# Patient Record
Sex: Male | Born: 1985 | Race: Black or African American | Hispanic: No | Marital: Single | Smoking: Former smoker
Health system: Southern US, Community
[De-identification: ages and names within clinical notes are randomized; demographics above are authoritative.]

## PROBLEM LIST (undated history)

## (undated) DIAGNOSIS — K219 Gastro-esophageal reflux disease without esophagitis: Secondary | ICD-10-CM

## (undated) DIAGNOSIS — I10 Essential (primary) hypertension: Secondary | ICD-10-CM

## (undated) DIAGNOSIS — J45909 Unspecified asthma, uncomplicated: Secondary | ICD-10-CM

## (undated) DIAGNOSIS — F909 Attention-deficit hyperactivity disorder, unspecified type: Secondary | ICD-10-CM

## (undated) HISTORY — PX: TONSILLECTOMY: SUR1361

---

## 2005-10-23 ENCOUNTER — Emergency Department (HOSPITAL_COMMUNITY): Admission: EM | Admit: 2005-10-23 | Discharge: 2005-10-23 | Payer: Self-pay | Admitting: Emergency Medicine

## 2006-07-05 ENCOUNTER — Emergency Department (HOSPITAL_COMMUNITY): Admission: EM | Admit: 2006-07-05 | Discharge: 2006-07-05 | Payer: Self-pay | Admitting: Emergency Medicine

## 2007-08-05 ENCOUNTER — Emergency Department (HOSPITAL_COMMUNITY): Admission: EM | Admit: 2007-08-05 | Discharge: 2007-08-05 | Payer: Self-pay | Admitting: Emergency Medicine

## 2007-11-17 ENCOUNTER — Emergency Department (HOSPITAL_COMMUNITY): Admission: EM | Admit: 2007-11-17 | Discharge: 2007-11-17 | Payer: Self-pay | Admitting: Emergency Medicine

## 2008-02-20 ENCOUNTER — Ambulatory Visit (HOSPITAL_COMMUNITY): Admission: RE | Admit: 2008-02-20 | Discharge: 2008-02-20 | Payer: Self-pay | Admitting: Urology

## 2008-04-08 ENCOUNTER — Emergency Department (HOSPITAL_COMMUNITY): Admission: EM | Admit: 2008-04-08 | Discharge: 2008-04-08 | Payer: Self-pay | Admitting: Emergency Medicine

## 2008-04-17 ENCOUNTER — Ambulatory Visit (HOSPITAL_COMMUNITY): Admission: RE | Admit: 2008-04-17 | Discharge: 2008-04-17 | Payer: Self-pay | Admitting: Internal Medicine

## 2008-07-02 ENCOUNTER — Emergency Department (HOSPITAL_COMMUNITY): Admission: EM | Admit: 2008-07-02 | Discharge: 2008-07-02 | Payer: Self-pay | Admitting: Emergency Medicine

## 2009-03-05 ENCOUNTER — Emergency Department (HOSPITAL_COMMUNITY): Admission: EM | Admit: 2009-03-05 | Discharge: 2009-03-05 | Payer: Self-pay | Admitting: Emergency Medicine

## 2009-04-14 ENCOUNTER — Emergency Department (HOSPITAL_COMMUNITY): Admission: EM | Admit: 2009-04-14 | Discharge: 2009-04-14 | Payer: Self-pay | Admitting: Emergency Medicine

## 2009-04-30 IMAGING — CT CT PARANASAL SINUSES LIMITED
2 series · 16 of 24 positions shown, 19 images · non-contrast
Comparison: None

CLINICAL DATA: Cough

CT PARANASAL SINUS LIMITED WITHOUT CONTRAST
TECHNIQUE: Multidetector CT images of the paranasal sinuses were
obtained in a single plane without contrast.

[Series 6: sinusprone 5.0 h31s · axial · 0.34mm/px · z∈[+177,+271]mm · 10 of 12 slices shown, 13 images (1 of 2)]
[im 2/12  brain]
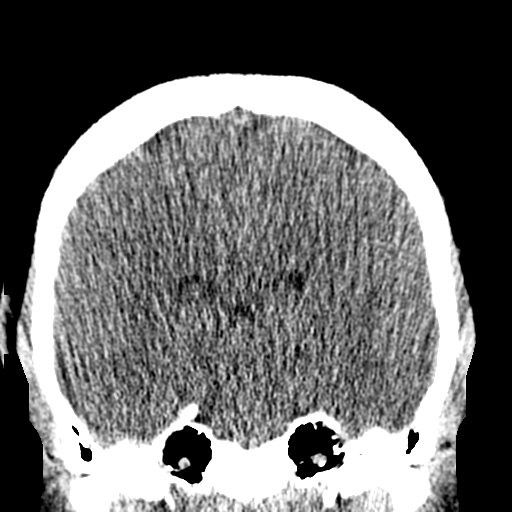
[im 2/12  bone]
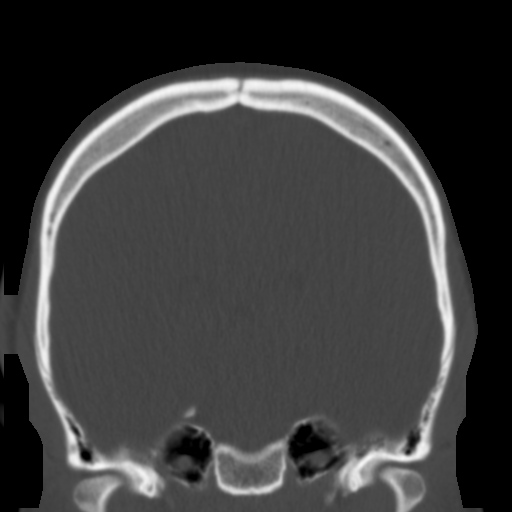
[im 3/12  bone]
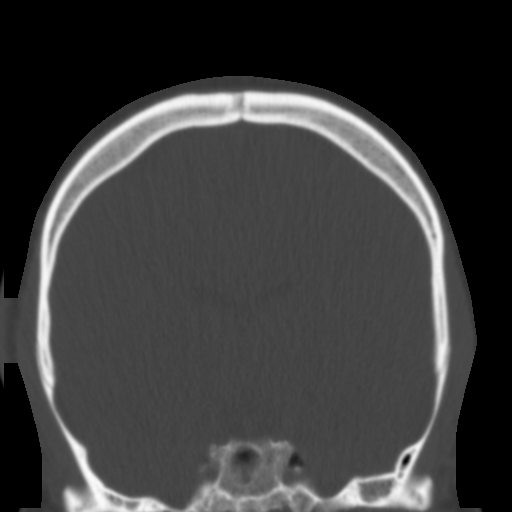
[im 4/12  bone]
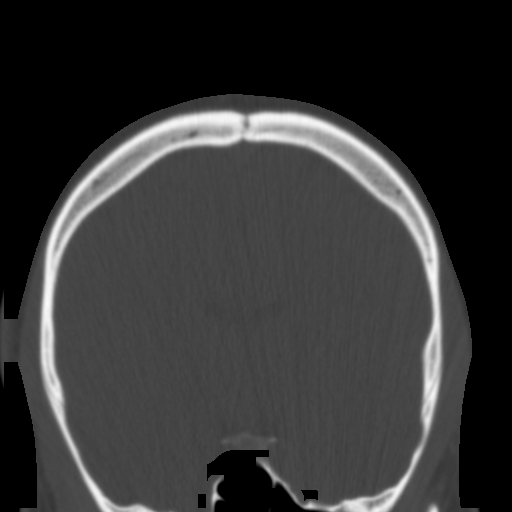
[im 5/12  bone]
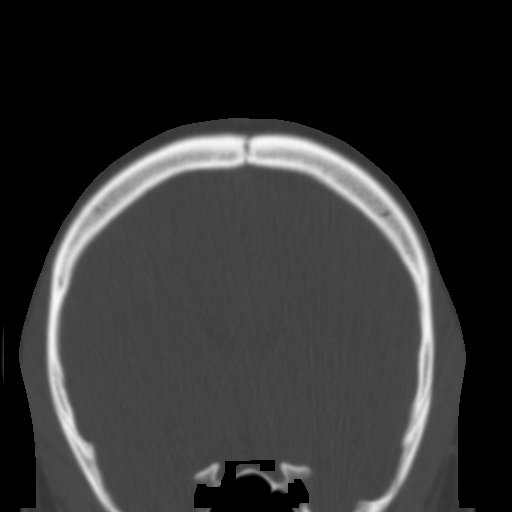
[im 6/12  brain]
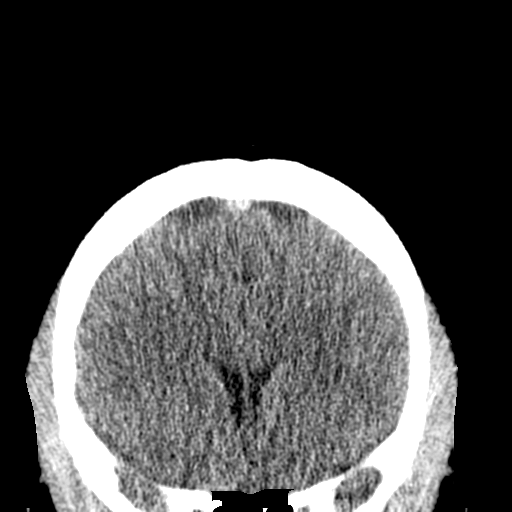
[im 6/12  bone]
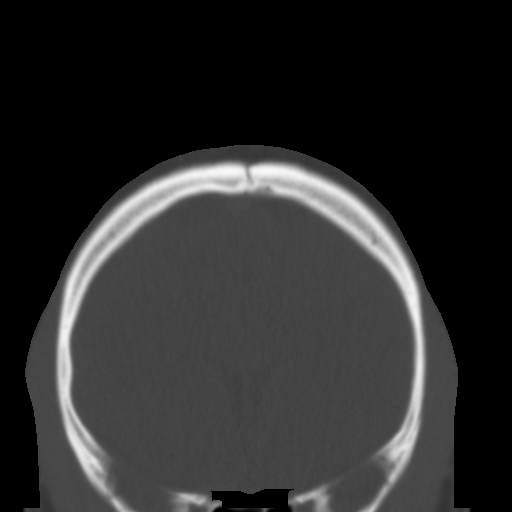
[im 7/12  bone]
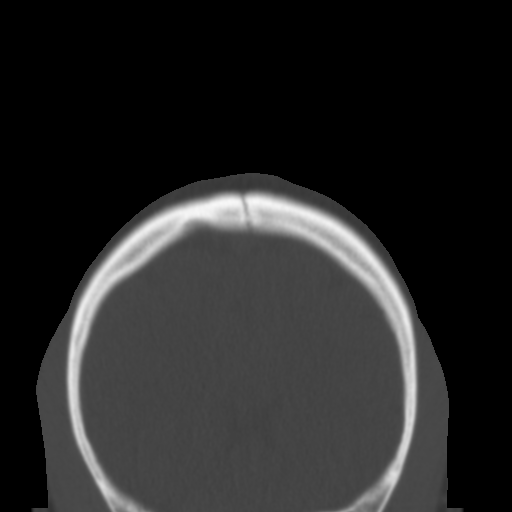
[im 8/12  bone]
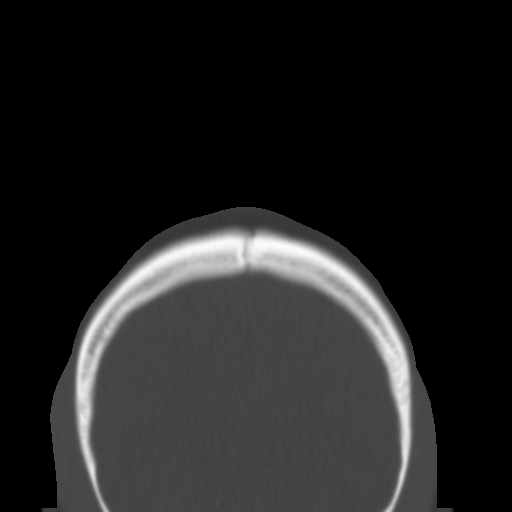
[im 9/12  bone]
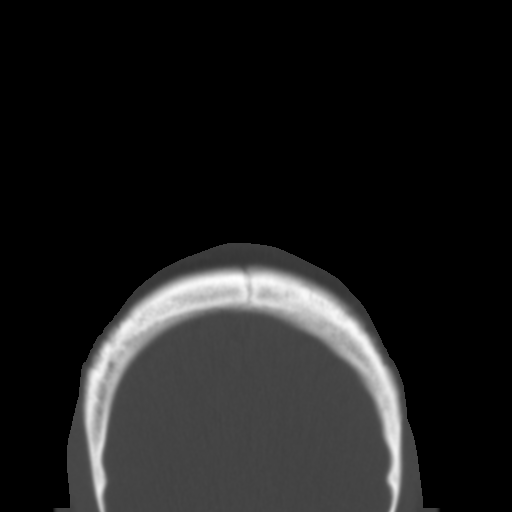
[im 10/12  brain]
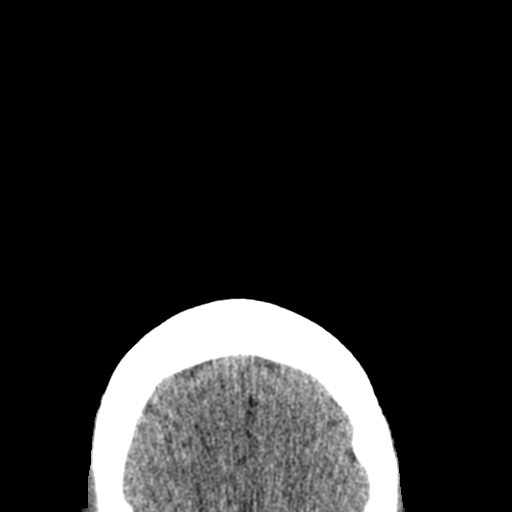
[im 10/12  bone]
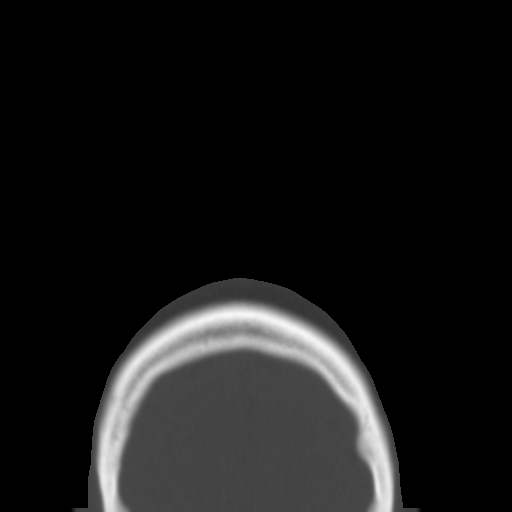
[im 11/12  bone]
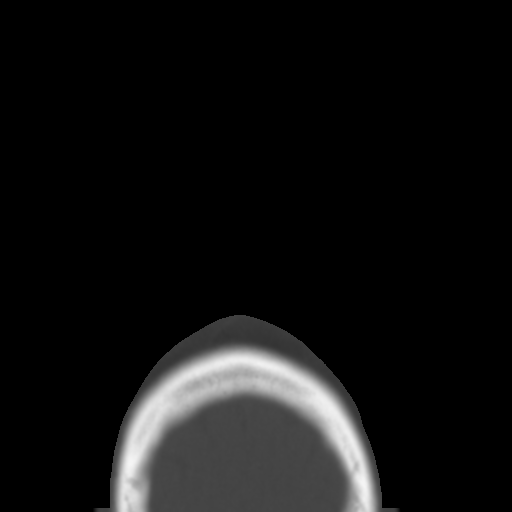

[Series 7: sinusprone 5.0 h31s · axial · 0.30mm/px · z∈[+125,+188]mm · 6 of 12 slices shown (2 of 2)]
[im 2/12  bone]
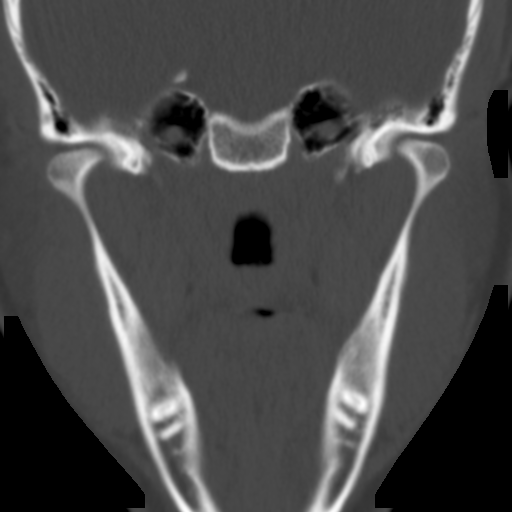
[im 3/12  bone]
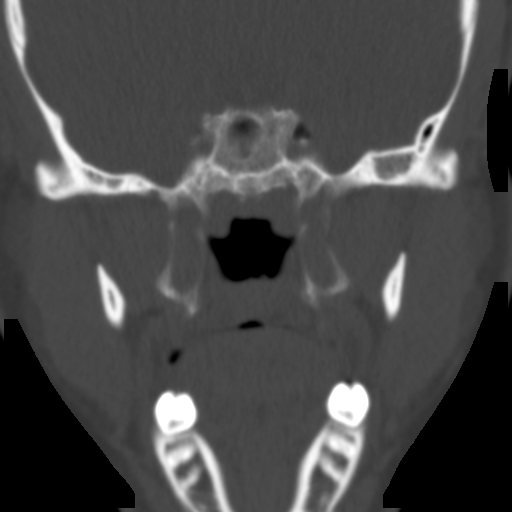
[im 5/12  bone]
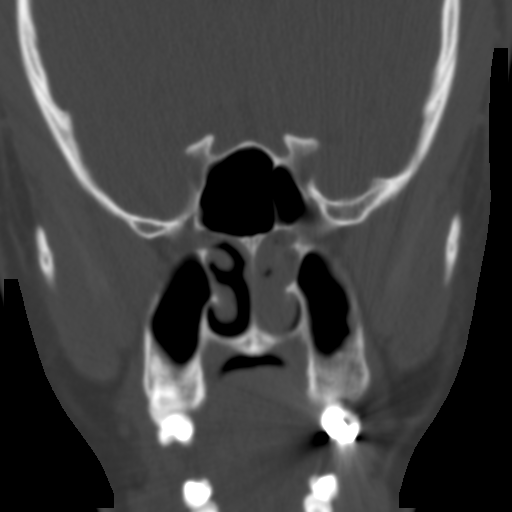
[im 6/12  bone]
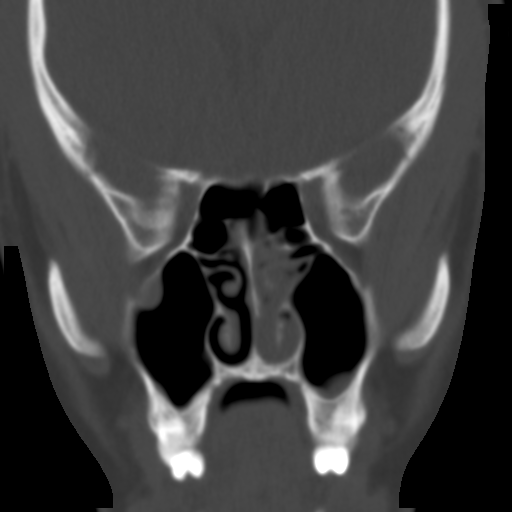
[im 7/12  bone]
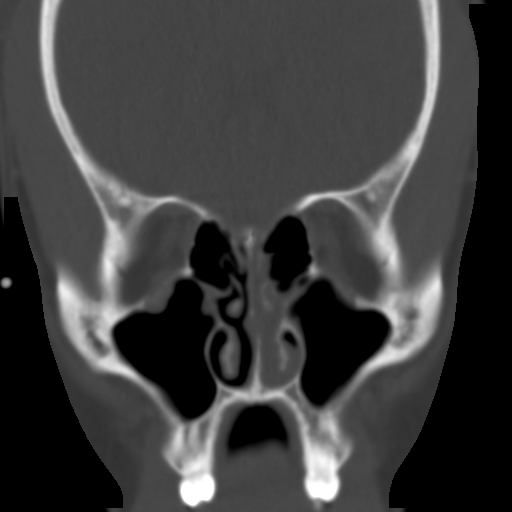
[im 8/12  bone]
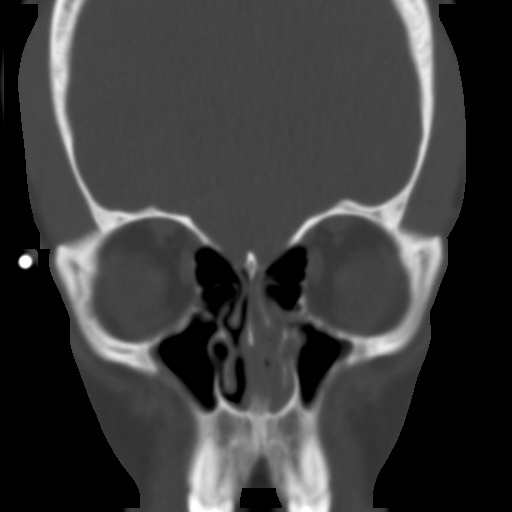

[16 of 24 positions shown; findings below may reference images not displayed]

FINDINGS: Limited survey of the paranasal sinuses demonstrates
scattered areas of mucosal thickening of the maxillary sinuses.
There is some thickening of some of the ethmoid air cells on the
left.  Asymmetric prominence of the nasal turbinates on the left.
Visualized portion of the sphenoid sinuses and the frontal sinuses
are clear.  No air-fluid levels are identified in the sinuses.  The
mandibular condyles are located.  Visualized orbital soft tissues
are unremarkable.
IMPRESSION: 1. Scattered mucosal thickening of the maxillary sinuses and left
ethmoid air cells, and prominence of the nasal turbinates on the
left.  These findings can be seen in patients with chronic
sinusitis. Nasal turbinate thickening can be seen in the setting of
allergic rhinitis.
2.  No air-fluid levels to suggest acute sinusitis are identified.

## 2010-02-08 ENCOUNTER — Emergency Department (HOSPITAL_COMMUNITY): Admission: EM | Admit: 2010-02-08 | Discharge: 2010-02-08 | Payer: Self-pay | Admitting: Emergency Medicine

## 2011-01-09 ENCOUNTER — Encounter: Payer: Self-pay | Admitting: Urology

## 2011-03-30 LAB — URINALYSIS, ROUTINE W REFLEX MICROSCOPIC
Bilirubin Urine: NEGATIVE
Leukocytes, UA: NEGATIVE
Protein, ur: NEGATIVE mg/dL
Urobilinogen, UA: 0.2 mg/dL (ref 0.0–1.0)

## 2011-03-30 LAB — RPR: RPR Ser Ql: NONREACTIVE

## 2011-03-30 LAB — GC/CHLAMYDIA PROBE AMP, GENITAL: Chlamydia, DNA Probe: NEGATIVE

## 2011-03-30 LAB — URINE MICROSCOPIC-ADD ON

## 2011-09-16 LAB — URINALYSIS, ROUTINE W REFLEX MICROSCOPIC
Bilirubin Urine: NEGATIVE
pH: 6

## 2011-09-16 LAB — CBC
HCT: 43.4
Hemoglobin: 14.4
MCV: 86
Platelets: 215
RBC: 5.04
RDW: 13.8
WBC: 5.9

## 2011-09-16 LAB — COMPREHENSIVE METABOLIC PANEL
Albumin: 3.7
CO2: 29
Calcium: 8.9
Creatinine, Ser: 1.03
Total Bilirubin: 0.4

## 2011-09-16 LAB — DIFFERENTIAL
Basophils Absolute: 0
Basophils Relative: 0
Lymphs Abs: 2.1
Monocytes Relative: 6
Neutrophils Relative %: 54

## 2011-09-16 LAB — LIPASE, BLOOD: Lipase: 30

## 2012-05-28 ENCOUNTER — Encounter (HOSPITAL_COMMUNITY): Payer: Self-pay | Admitting: Emergency Medicine

## 2012-05-28 ENCOUNTER — Emergency Department (HOSPITAL_COMMUNITY): Payer: Medicaid Other

## 2012-05-28 ENCOUNTER — Emergency Department (HOSPITAL_COMMUNITY)
Admission: EM | Admit: 2012-05-28 | Discharge: 2012-05-28 | Disposition: A | Discharge status: Home / Self Care | Payer: Medicaid Other | Attending: Emergency Medicine | Admitting: Emergency Medicine

## 2012-05-28 DIAGNOSIS — M549 Dorsalgia, unspecified: Secondary | ICD-10-CM | POA: Insufficient documentation

## 2012-05-28 DIAGNOSIS — T07XXXA Unspecified multiple injuries, initial encounter: Secondary | ICD-10-CM

## 2012-05-28 DIAGNOSIS — IMO0002 Reserved for concepts with insufficient information to code with codable children: Secondary | ICD-10-CM | POA: Insufficient documentation

## 2012-05-28 DIAGNOSIS — R109 Unspecified abdominal pain: Secondary | ICD-10-CM | POA: Insufficient documentation

## 2012-05-28 DIAGNOSIS — R404 Transient alteration of awareness: Secondary | ICD-10-CM | POA: Insufficient documentation

## 2012-05-28 DIAGNOSIS — I10 Essential (primary) hypertension: Secondary | ICD-10-CM | POA: Insufficient documentation

## 2012-05-28 DIAGNOSIS — M542 Cervicalgia: Secondary | ICD-10-CM | POA: Insufficient documentation

## 2012-05-28 DIAGNOSIS — M25569 Pain in unspecified knee: Secondary | ICD-10-CM | POA: Insufficient documentation

## 2012-05-28 HISTORY — DX: Unspecified asthma, uncomplicated: J45.909

## 2012-05-28 HISTORY — DX: Essential (primary) hypertension: I10

## 2012-05-28 LAB — CBC
HCT: 46.1 % (ref 39.0–52.0)
Hemoglobin: 15.6 g/dL (ref 13.0–17.0)
RDW: 13.6 % (ref 11.5–15.5)
WBC: 10.2 10*3/uL (ref 4.0–10.5)

## 2012-05-28 LAB — RAPID URINE DRUG SCREEN, HOSP PERFORMED
Amphetamines: NOT DETECTED
Barbiturates: NOT DETECTED
Benzodiazepines: NOT DETECTED
Tetrahydrocannabinol: POSITIVE — AB

## 2012-05-28 LAB — COMPREHENSIVE METABOLIC PANEL
Alkaline Phosphatase: 51 U/L (ref 39–117)
BUN: 13 mg/dL (ref 6–23)
CO2: 25 mEq/L (ref 19–32)
Calcium: 9.7 mg/dL (ref 8.4–10.5)
GFR calc Af Amer: 78 mL/min — ABNORMAL LOW (ref >90)
GFR calc non Af Amer: 67 mL/min — ABNORMAL LOW (ref >90)
Glucose, Bld: 100 mg/dL — ABNORMAL HIGH (ref 70–99)
Total Protein: 8 g/dL (ref 6.0–8.3)

## 2012-05-28 LAB — POCT I-STAT, CHEM 8
HCT: 49 % (ref 39.0–52.0)
Hemoglobin: 16.7 g/dL (ref 13.0–17.0)
Potassium: 3.9 mEq/L (ref 3.5–5.1)
Sodium: 142 mEq/L (ref 135–145)
TCO2: 26 mmol/L (ref 0–100)

## 2012-05-28 LAB — URINALYSIS, MICROSCOPIC ONLY
Hgb urine dipstick: NEGATIVE
Leukocytes, UA: NEGATIVE
Nitrite: NEGATIVE
Protein, ur: NEGATIVE mg/dL
Specific Gravity, Urine: 1.038 — ABNORMAL HIGH (ref 1.005–1.030)
Urobilinogen, UA: 0.2 mg/dL (ref 0.0–1.0)

## 2012-05-28 LAB — ETHANOL: Alcohol, Ethyl (B): <11 mg/dL (ref 0–11)

## 2012-05-28 LAB — SAMPLE TO BLOOD BANK

## 2012-05-28 MED ORDER — IOHEXOL 300 MG/ML  SOLN
100.0000 mL | Freq: Once | INTRAMUSCULAR | Status: AC | PRN
  Administered 2012-05-28: 100 mL via INTRAVENOUS

## 2012-05-28 MED ORDER — FENTANYL CITRATE 0.05 MG/ML IJ SOLN
50.0000 ug | Freq: Once | INTRAMUSCULAR | Status: AC
  Administered 2012-05-28: 50 ug via INTRAVENOUS
  Filled 2012-05-28: qty 2

## 2012-05-28 MED ORDER — OXYCODONE-ACETAMINOPHEN 5-325 MG PO TABS
1.0000 | ORAL_TABLET | Freq: Once | ORAL | Status: AC
  Administered 2012-05-28: 1 via ORAL
  Filled 2012-05-28: qty 1

## 2012-05-28 MED ORDER — FENTANYL CITRATE 0.05 MG/ML IJ SOLN
50.0000 ug | Freq: Once | INTRAMUSCULAR | Status: AC
  Administered 2012-05-28: 50 ug via INTRAVENOUS
  Filled 2012-05-28 (×2): qty 2

## 2012-05-28 MED ORDER — OXYCODONE-ACETAMINOPHEN 5-325 MG PO TABS: 1.0000 | ORAL_TABLET | ORAL | Status: AC | PRN

## 2012-05-28 MED ORDER — SODIUM CHLORIDE 0.9 % IV BOLUS (SEPSIS)
1000.0000 mL | Freq: Once | INTRAVENOUS | Status: AC
  Administered 2012-05-28: 1000 mL via INTRAVENOUS

## 2012-05-28 MED ORDER — ONDANSETRON HCL 4 MG/2ML IJ SOLN
4.0000 mg | Freq: Once | INTRAMUSCULAR | Status: AC
  Administered 2012-05-28: 4 mg via INTRAVENOUS
  Filled 2012-05-28: qty 2
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
## 2012-05-28 NOTE — ED Notes (Signed)
Patient currently in C-spine and long spine board.
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
## 2016-06-05 ENCOUNTER — Emergency Department (HOSPITAL_COMMUNITY)
Admission: EM | Admit: 2016-06-05 | Discharge: 2016-06-05 | Disposition: A | Discharge status: Home / Self Care | Payer: Medicaid Other | Attending: Emergency Medicine | Admitting: Emergency Medicine

## 2016-06-05 ENCOUNTER — Encounter (HOSPITAL_COMMUNITY): Payer: Self-pay

## 2016-06-05 DIAGNOSIS — M542 Cervicalgia: Secondary | ICD-10-CM | POA: Insufficient documentation

## 2016-06-05 DIAGNOSIS — Z79899 Other long term (current) drug therapy: Secondary | ICD-10-CM | POA: Insufficient documentation

## 2016-06-05 DIAGNOSIS — G8929 Other chronic pain: Secondary | ICD-10-CM | POA: Diagnosis not present

## 2016-06-05 DIAGNOSIS — M549 Dorsalgia, unspecified: Secondary | ICD-10-CM | POA: Insufficient documentation

## 2016-06-05 DIAGNOSIS — I10 Essential (primary) hypertension: Secondary | ICD-10-CM | POA: Insufficient documentation

## 2016-06-05 DIAGNOSIS — J45909 Unspecified asthma, uncomplicated: Secondary | ICD-10-CM | POA: Diagnosis not present
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
## 2016-06-05 NOTE — Discharge Instructions (Signed)
°Cervical Strain and Sprain With Rehab °Cervical strain and sprain are injuries that commonly occur with "whiplash" injuries. Whiplash occurs when the neck is forcefully whipped backward or forward, such as during a motor vehicle accident or during contact sports. The muscles, ligaments, tendons, discs, and nerves of the neck are susceptible to injury when this occurs. °RISK FACTORS °Risk of having a whiplash injury increases if: °· Osteoarthritis of the spine. °· Situations that make head or neck accidents or trauma more likely. °· High-risk sports (football, rugby, wrestling, hockey, auto racing, gymnastics, diving, contact karate, or boxing). °· Poor strength and flexibility of the neck. °· Previous neck injury. °· Poor tackling technique. °· Improperly fitted or padded equipment. °SYMPTOMS  °· Pain or stiffness in the front or back of neck or both. °· Symptoms may present immediately or up to 24 hours after injury. °· Dizziness, headache, nausea, and vomiting. °· Muscle spasm with soreness and stiffness in the neck. °· Tenderness and swelling at the injury site. °PREVENTION °· Learn and use proper technique (avoid tackling with the head, spearing, and head-butting; use proper falling techniques to avoid landing on the head). °· Warm up and stretch properly before activity. °· Maintain physical fitness: °¨ Strength, flexibility, and endurance. °¨ Cardiovascular fitness. °· Wear properly fitted and padded protective equipment, such as padded soft collars, for participation in contact sports. °PROGNOSIS  °Recovery from cervical strain and sprain injuries is dependent on the extent of the injury. These injuries are usually curable in 1 week to 3 months with appropriate treatment.  °RELATED COMPLICATIONS  °· Temporary numbness and weakness may occur if the nerve roots are damaged, and this may persist until the nerve has completely healed. °· Chronic pain due to frequent recurrence of symptoms. °· Prolonged healing,  especially if activity is resumed too soon (before complete recovery). °TREATMENT  °Treatment initially involves the use of ice and medication to help reduce pain and inflammation. It is also important to perform strengthening and stretching exercises and modify activities that worsen symptoms so the injury does not get worse. These exercises may be performed at home or with a therapist. For patients who experience severe symptoms, a soft, padded collar may be recommended to be worn around the neck.  °Improving your posture may help reduce symptoms. Posture improvement includes pulling your chin and abdomen in while sitting or standing. If you are sitting, sit in a firm chair with your buttocks against the back of the chair. While sleeping, try replacing your pillow with a small towel rolled to 2 inches in diameter, or use a cervical pillow or soft cervical collar. Poor sleeping positions delay healing.  °For patients with nerve root damage, which causes numbness or weakness, the use of a cervical traction apparatus may be recommended. Surgery is rarely necessary for these injuries. However, cervical strain and sprains that are present at birth (congenital) may require surgery. °MEDICATION  °· If pain medication is necessary, nonsteroidal anti-inflammatory medications, such as aspirin and ibuprofen, or other minor pain relievers, such as acetaminophen, are often recommended. °· Do not take pain medication for 7 days before surgery. °· Prescription pain relievers may be given if deemed necessary by your caregiver. Use only as directed and only as much as you need. °HEAT AND COLD:  °· Cold treatment (icing) relieves pain and reduces inflammation. Cold treatment should be applied for 10 to 15 minutes every 2 to 3 hours for inflammation and pain and immediately after any activity that aggravates your   HEAT AND COLD:   · Cold treatment (icing) relieves pain and reduces inflammation. Cold treatment should be applied for 10 to 15 minutes every 2 to 3 hours for inflammation and pain and immediately after any activity that aggravates your symptoms. Use ice packs or an ice massage.  · Heat treatment may be used prior to performing the stretching and  strengthening activities prescribed by your caregiver, physical therapist, or athletic trainer. Use a heat pack or a warm soak.  SEEK MEDICAL CARE IF:   · Symptoms get worse or do not improve in 2 weeks despite treatment.  · New, unexplained symptoms develop (drugs used in treatment may produce side effects).  EXERCISES  RANGE OF MOTION (ROM) AND STRETCHING EXERCISES - Cervical Strain and Sprain  These exercises may help you when beginning to rehabilitate your injury. In order to successfully resolve your symptoms, you must improve your posture. These exercises are designed to help reduce the forward-head and rounded-shoulder posture which contributes to this condition. Your symptoms may resolve with or without further involvement from your physician, physical therapist or athletic trainer. While completing these exercises, remember:   · Restoring tissue flexibility helps normal motion to return to the joints. This allows healthier, less painful movement and activity.  · An effective stretch should be held for at least 20 seconds, although you may need to begin with shorter hold times for comfort.  · A stretch should never be painful. You should only feel a gentle lengthening or release in the stretched tissue.  STRETCH- Axial Extensors  · Lie on your back on the floor. You may bend your knees for comfort. Place a rolled-up hand towel or dish towel, about 2 inches in diameter, under the part of your head that makes contact with the floor.  · Gently tuck your chin, as if trying to make a "double chin," until you feel a gentle stretch at the base of your head.  · Hold __________ seconds.  Repeat __________ times. Complete this exercise __________ times per day.   STRETCH - Axial Extension   · Stand or sit on a firm surface. Assume a good posture: chest up, shoulders drawn back, abdominal muscles slightly tense, knees unlocked (if standing) and feet hip width apart.  · Slowly retract your chin so your head slides back  and your chin slightly lowers. Continue to look straight ahead.  · You should feel a gentle stretch in the back of your head. Be certain not to feel an aggressive stretch since this can cause headaches later.  · Hold for __________ seconds.  Repeat __________ times. Complete this exercise __________ times per day.  STRETCH - Cervical Side Bend   · Stand or sit on a firm surface. Assume a good posture: chest up, shoulders drawn back, abdominal muscles slightly tense, knees unlocked (if standing) and feet hip width apart.  · Without letting your nose or shoulders move, slowly tip your right / left ear to your shoulder until your feel a gentle stretch in the muscles on the opposite side of your neck.  · Hold __________ seconds.  Repeat __________ times. Complete this exercise __________ times per day.  STRETCH - Cervical Rotators   · Stand or sit on a firm surface. Assume a good posture: chest up, shoulders drawn back, abdominal muscles slightly tense, knees unlocked (if standing) and feet hip width apart.  · Keeping your eyes level with the ground, slowly turn your head until you feel a gentle stretch along   the back and opposite side of your neck.  · Hold __________ seconds.  Repeat __________ times. Complete this exercise __________ times per day.  RANGE OF MOTION - Neck Circles   · Stand or sit on a firm surface. Assume a good posture: chest up, shoulders drawn back, abdominal muscles slightly tense, knees unlocked (if standing) and feet hip width apart.  · Gently roll your head down and around from the back of one shoulder to the back of the other. The motion should never be forced or painful.  · Repeat the motion 10-20 times, or until you feel the neck muscles relax and loosen.  Repeat __________ times. Complete the exercise __________ times per day.  STRENGTHENING EXERCISES - Cervical Strain and Sprain  These exercises may help you when beginning to rehabilitate your injury. They may resolve your symptoms with or  without further involvement from your physician, physical therapist, or athletic trainer. While completing these exercises, remember:   · Muscles can gain both the endurance and the strength needed for everyday activities through controlled exercises.  · Complete these exercises as instructed by your physician, physical therapist, or athletic trainer. Progress the resistance and repetitions only as guided.  · You may experience muscle soreness or fatigue, but the pain or discomfort you are trying to eliminate should never worsen during these exercises. If this pain does worsen, stop and make certain you are following the directions exactly. If the pain is still present after adjustments, discontinue the exercise until you can discuss the trouble with your clinician.  STRENGTH - Cervical Flexors, Isometric  · Face a wall, standing about 6 inches away. Place a small pillow, a ball about 6-8 inches in diameter, or a folded towel between your forehead and the wall.  · Slightly tuck your chin and gently push your forehead into the soft object. Push only with mild to moderate intensity, building up tension gradually. Keep your jaw and forehead relaxed.  · Hold 10 to 20 seconds. Keep your breathing relaxed.  · Release the tension slowly. Relax your neck muscles completely before you start the next repetition.  Repeat __________ times. Complete this exercise __________ times per day.  STRENGTH- Cervical Lateral Flexors, Isometric   · Stand about 6 inches away from a wall. Place a small pillow, a ball about 6-8 inches in diameter, or a folded towel between the side of your head and the wall.  · Slightly tuck your chin and gently tilt your head into the soft object. Push only with mild to moderate intensity, building up tension gradually. Keep your jaw and forehead relaxed.  · Hold 10 to 20 seconds. Keep your breathing relaxed.  · Release the tension slowly. Relax your neck muscles completely before you start the next  repetition.  Repeat __________ times. Complete this exercise __________ times per day.  STRENGTH - Cervical Extensors, Isometric   · Stand about 6 inches away from a wall. Place a small pillow, a ball about 6-8 inches in diameter, or a folded towel between the back of your head and the wall.  · Slightly tuck your chin and gently tilt your head back into the soft object. Push only with mild to moderate intensity, building up tension gradually. Keep your jaw and forehead relaxed.  · Hold 10 to 20 seconds. Keep your breathing relaxed.  · Release the tension slowly. Relax your neck muscles completely before you start the next repetition.  Repeat __________ times. Complete this exercise __________ times per day.    All of your joints have less wear and tear when properly supported by a spine with good posture. This means you will experience a healthier, less painful body.  Correct posture must be practiced with all of your activities, especially prolonged sitting and standing. Correct posture is as important when doing repetitive low-stress activities (typing) as it is when doing a single heavy-load activity (lifting). PROLONGED STANDING WHILE SLIGHTLY LEANING FORWARD When completing a task that requires you to lean forward while standing in one  place for a long time, place either foot up on a stationary 2- to 4-inch high object to help maintain the best posture. When both feet are on the ground, the low back tends to lose its slight inward curve. If this curve flattens (or becomes too large), then the back and your other joints will experience too much stress, fatigue more quickly, and can cause pain.  RESTING POSITIONS Consider which positions are most painful for you when choosing a resting position. If you have pain with flexion-based activities (sitting, bending, stooping, squatting), choose a position that allows you to rest in a less flexed posture. You would want to avoid curling into a fetal position on your side. If your pain worsens with extension-based activities (prolonged standing, working overhead), avoid resting in an extended position such as sleeping on your stomach. Most people will find more comfort when they rest with their spine in a more neutral position, neither too rounded nor too arched. Lying on a non-sagging bed on your side with a pillow between your knees, or on your back with a pillow under your knees will often provide some relief. Keep in mind, being in any one position for a prolonged period of time, no matter how correct your posture, can still lead to stiffness. WALKING Walk with an upright posture. Your ears, shoulders, and hips should all line up. OFFICE WORK When working at a desk, create an environment that supports good, upright posture. Without extra support, muscles fatigue and lead to excessive strain on joints and other tissues. CHAIR:  A chair should be able to slide under your desk when your back makes contact with the back of the chair. This allows you to work closely.  The chair's height should allow your eyes to be level with the upper part of your monitor and your hands to be slightly lower than your elbows.  Body position:  Your feet should make contact with the floor. If this is not  possible, use a foot rest.  Keep your ears over your shoulders. This will reduce stress on your neck and low back.   This information is not intended to replace advice given to you by your health care provider. Make sure you discuss any questions you have with your health care provider.   Document Released: 12/05/2005 Document Revised: 12/26/2014 Document Reviewed: 03/19/2009 Elsevier Interactive Patient Education 2016 [employer]  Chronic Pain Chronic pain can be defined as pain that is off and on and lasts for 3-6 months or longer. Many things cause chronic pain, which can make it difficult to make a diagnosis. There are many treatment options available for chronic pain. However, finding a treatment that works well for you may require trying various approaches until the right one is found. Many people benefit from a combination of two or more types of treatment to control their pain. SYMPTOMS  Chronic pain can occur anywhere in the body and can range from mild to very severe. Some types of chronic  pain include:  Headache.  Low back pain.  Cancer pain.  Arthritis pain.  Neurogenic pain. This is pain resulting from damage to nerves. People with chronic pain may also have other symptoms such as:  Depression.  Anger.  Insomnia.  Anxiety. DIAGNOSIS  Your health care provider will help diagnose your condition over time. In many cases, the initial focus will be on excluding possible conditions that could be causing the pain. Depending on your symptoms, your health care provider may order tests to diagnose your condition. Some of these tests may include:   Blood tests.   CT scan.   MRI.   X-rays.   Ultrasounds.   Nerve conduction studies.  You may need to see a specialist.  TREATMENT  Finding treatment that works well may take time. You may be referred to a pain specialist. He or she may prescribe medicine or therapies, such as:   Mindful meditation or  yoga.  Shots (injections) of numbing or pain-relieving medicines into the spine or area of pain.  Local electrical stimulation.  Acupuncture.   Massage therapy.   Aroma, color, light, or sound therapy.   Biofeedback.   Working with a physical therapist to keep from getting stiff.   Regular, gentle exercise.   Cognitive or behavioral therapy.   Group support.  Sometimes, surgery may be recommended.  HOME CARE INSTRUCTIONS   Take all medicines as directed by your health care provider.   Lessen stress in your life by relaxing and doing things such as listening to calming music.   Exercise or be active as directed by your health care provider.   Eat a healthy diet and include things such as vegetables, fruits, fish, and lean meats in your diet.   Keep all follow-up appointments with your health care provider.   Attend a support group with others suffering from chronic pain. SEEK MEDICAL CARE IF:   Your pain gets worse.   You develop a new pain that was not there before.   You cannot tolerate medicines given to you by your health care provider.   You have new symptoms since your last visit with your health care provider.  SEEK IMMEDIATE MEDICAL CARE IF:   You feel weak.   You have decreased sensation or numbness.   You lose control of bowel or bladder function.   Your pain suddenly gets much worse.   You develop shaking.  You develop chills.  You develop confusion.  You develop chest pain.  You develop shortness of breath.  MAKE SURE YOU:  Understand these instructions.  Will watch your condition.  Will get help right away if you are not doing well or get worse.   This information is not intended to replace advice given to you by your health care provider. Make sure you discuss any questions you have with your health care provider.   Document Released: 08/27/2002 Document Revised: 08/07/2013 Document Reviewed:  05/31/2013 Elsevier Interactive Patient Education [employer]

## 2016-06-05 NOTE — ED Notes (Signed)
Declined W/C at D/C and was escorted to lobby by RN. 
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
## 2019-08-02 ENCOUNTER — Other Ambulatory Visit: Payer: Self-pay

## 2019-08-02 DIAGNOSIS — Z20822 Contact with and (suspected) exposure to covid-19: Secondary | ICD-10-CM

## 2019-08-03 LAB — NOVEL CORONAVIRUS, NAA: SARS-CoV-2, NAA: NOT DETECTED

## 2020-07-13 ENCOUNTER — Ambulatory Visit
Admission: EM | Admit: 2020-07-13 | Discharge: 2020-07-13 | Disposition: A | Discharge status: Home / Self Care | Payer: Medicaid Other | Attending: Family Medicine | Admitting: Family Medicine

## 2020-07-13 ENCOUNTER — Encounter: Payer: Self-pay | Admitting: Emergency Medicine

## 2020-07-13 DIAGNOSIS — B369 Superficial mycosis, unspecified: Secondary | ICD-10-CM

## 2020-07-13 DIAGNOSIS — Z113 Encounter for screening for infections with a predominantly sexual mode of transmission: Secondary | ICD-10-CM | POA: Insufficient documentation

## 2020-07-13 DIAGNOSIS — R319 Hematuria, unspecified: Secondary | ICD-10-CM | POA: Insufficient documentation

## 2020-07-13 LAB — POCT URINALYSIS DIP (MANUAL ENTRY)
Bilirubin, UA: NEGATIVE
Glucose, UA: NEGATIVE mg/dL
Ketones, POC UA: NEGATIVE mg/dL
Leukocytes, UA: NEGATIVE
Nitrite, UA: NEGATIVE
Protein Ur, POC: NEGATIVE mg/dL
Spec Grav, UA: 1.015 (ref 1.010–1.025)
Urobilinogen, UA: 1 E.U./dL
pH, UA: 7 (ref 5.0–8.0)

## 2020-07-13 MED ORDER — CLOTRIMAZOLE 1 % EX CREA: TOPICAL_CREAM | CUTANEOUS | 0 refills | Status: DC

## 2020-07-13 NOTE — Discharge Instructions (Addendum)
Your swab tests are pending. We will be in touch with you about any abnormal results and will treat accordingly  I have sent in lotrimin for you to use topically for the odor  You may have a urinary tract infection. We are going to culture your urine and will call you as soon as we have the results.   Drink plenty of water, 8-10 glasses per day.   Follow up with your primary care provider as needed.   Go to the Emergency Department if you experience severe pain, shortness of breath, high fever, or other concerns.

## 2020-07-13 NOTE — ED Triage Notes (Signed)
Pt has blood in urine. Pt is worried he may have a std.

## 2020-07-15 LAB — CYTOLOGY, (ORAL, ANAL, URETHRAL) ANCILLARY ONLY
Bacterial Vaginitis (gardnerella): NEGATIVE
Candida Glabrata: NEGATIVE
Candida Vaginitis: NEGATIVE
Chlamydia: NEGATIVE
Comment: NEGATIVE
Comment: NEGATIVE
Comment: NEGATIVE
Comment: NEGATIVE
Comment: NEGATIVE
Comment: NORMAL
Neisseria Gonorrhea: NEGATIVE
Trichomonas: NEGATIVE

## 2020-07-16 LAB — URINE CULTURE: Culture: <10000 — AB

## 2020-07-19 NOTE — ED Provider Notes (Signed)
[facility]   [id number] 07/13/20 Arrival Time: 1938   CC: VAGINAL DISCHARGE  SUBJECTIVE:  Ricky Cook is a 34 y.o. male who presents with complaints of gradual darkening of his urine. He reports seeing some sediment in his urine yesterday and today. Requesting STD screening today as well. Has not taken OTC medications for dysuria. Also reports that he has had a darker itchy rash to his pubic area, that has an odor. Reports that he has not experienced symptoms like this before. There are no aggravating or alleviating factors. He denies fever, chills, nausea, vomiting, abdominal or pelvic pain, penile pain, penile discharge.   ROS: As per HPI.  All other pertinent ROS negative.     Past Medical History:  Diagnosis Date  . Asthma   . Hypertension    History reviewed. No pertinent surgical history. Allergies  Allergen Reactions  . Augmentin [Amoxicillin-Pot Clavulanate] Other (See Comments)    thrush   No current facility-administered medications on file prior to encounter.   Current Outpatient Medications on File Prior to Encounter  Medication Sig Dispense Refill  . benazepril-hydrochlorthiazide (LOTENSIN HCT) 20-12.5 MG per tablet Take 1 tablet by mouth daily.    . busPIRone (BUSPAR) 15 MG tablet Take 15 mg by mouth 3 (three) times daily.    . fexofenadine-pseudoephedrine (ALLEGRA-D) 60-120 MG per tablet Take 1 tablet by mouth daily.    . fluticasone (FLONASE) 50 MCG/ACT nasal spray Place 2 sprays into the nose daily.      Social History   Socioeconomic History  . Marital status: Single    Spouse name: Not on file  . Number of children: Not on file  . Years of education: Not on file  . Highest education level: Not on file  Occupational History  . Not on file  Tobacco Use  . Smoking status: Current Every Day Smoker    Packs/day: 0.50    Types: Cigarettes  . Smokeless tobacco: Never Used  Substance and Sexual Activity  . Alcohol use: Never  . Drug use:  Never  . Sexual activity: Not on file  Other Topics Concern  . Not on file  Social History Narrative  . Not on file   Social Determinants of Health   Financial Resource Strain:   . Difficulty of Paying Living Expenses:   Food Insecurity:   . Worried About Programme researcher, broadcasting/film/video in the Last Year:   . Barista in the Last Year:   Transportation Needs:   . Freight forwarder (Medical):   Marland Kitchen Lack of Transportation (Non-Medical):   Physical Activity:   . Days of Exercise per Week:   . Minutes of Exercise per Session:   Stress:   . Feeling of Stress :   Social Connections:   . Frequency of Communication with Friends and Family:   . Frequency of Social Gatherings with Friends and Family:   . Attends Religious Services:   . Active Member of Clubs or Organizations:   . Attends Banker Meetings:   Marland Kitchen Marital Status:   Intimate Partner Violence:   . Fear of Current or Ex-Partner:   . Emotionally Abused:   Marland Kitchen Physically Abused:   . Sexually Abused:    No family history on file.  OBJECTIVE:  Vitals:   07/13/20 1949 07/13/20 1950  BP:  (!) 157/104  Pulse:  88  Resp:  17  Temp:  98.3 F (36.8 C)  TempSrc:  Oral  SpO2:  95%  Weight: (!) 284 lb 6.3 oz (129 kg)   Height: 5\' 11"  (1.803 m)      General appearance: Alert, NAD, appears stated age Head: NCAT Throat: lips, mucosa, and tongue normal; teeth and gums normal Lungs: CTA bilaterally without adventitious breath sounds Heart: regular rate and rhythm.  Radial pulses 2+ symmetrical bilaterally Back: no CVA tenderness Abdomen: soft, non-tender; bowel sounds normal; no masses or organomegaly; no guarding or rebound tenderness GU: declines  Skin: warm and dry Psychological:  Alert and cooperative. Normal mood and affect.  LABS:  Results for orders placed or performed during the hospital encounter of 07/13/20  Urine culture   Specimen: Urine, Clean Catch  Result Value Ref Range   Specimen Description       URINE, CLEAN CATCH Performed at [facility], [address], [place], [place]    Special Requests      NONE Performed at [facility], [address], [place], [place]    Culture (A)     <10,000 COLONIES/mL INSIGNIFICANT GROWTH Performed at [facility], [address], [place], [place]    Report Status 07/16/2020 FINAL   POCT urinalysis dipstick  Result Value Ref Range   Color, UA yellow yellow   Clarity, UA cloudy (A) clear   Glucose, UA negative negative mg/dL   Bilirubin, UA negative negative   Ketones, POC UA negative negative mg/dL   Spec Grav, UA 07/18/2020 0.973 - 1.025   Blood, UA large (A) negative   pH, UA 7.0 5.0 - 8.0   Protein Ur, POC negative negative mg/dL   Urobilinogen, UA 1.0 0.2 or 1.0 E.U./dL   Nitrite, UA Negative Negative   Leukocytes, UA Negative Negative  Cytology (oral, anal, urethral) ancillary only  Result Value Ref Range   Bacterial Vaginitis (gardnerella) Negative    Chlamydia Negative    Neisseria Gonorrhea Negative    Candida Vaginitis Negative    Candida Glabrata Negative    Trichomonas Negative    Comment      Normal Reference Range Bacterial Vaginosis - Negative   Comment Normal Reference Ranger Chlamydia - Negative    Comment      Normal Reference Range Neisseria Gonorrhea - Negative   Comment Normal Reference Range Candida Species - Negative    Comment Normal Reference Range Candida Galbrata - Negative    Comment Normal Reference Range Trichomonas - Negative     Labs Reviewed  URINE CULTURE - Abnormal; Notable for the following components:      Result Value   Culture   (*)    Value: <10,000 COLONIES/mL INSIGNIFICANT GROWTH Performed at [facility], [address], [place], [place]    All other components within normal limits  POCT URINALYSIS DIP (MANUAL ENTRY) - Abnormal; Notable for the following components:   Clarity, UA cloudy (*)    Blood, UA large (*)    All other  components within normal limits  CYTOLOGY, (ORAL, ANAL, URETHRAL) ANCILLARY ONLY    ASSESSMENT & PLAN:  1. Hematuria, unspecified type   2. Screen for STD (sexually transmitted disease)   3. Fungal skin infection     Meds ordered this encounter  Medications  . clotrimazole (LOTRIMIN) 1 % cream    Sig: Apply to affected area 2 times daily    Dispense:  15 g    Refill:  0    Order Specific Question:   Supervising Provider    Answer:  LAMPTEYBritta Mccreedy [[id number]]    Pending: Labs Reviewed  URINE CULTURE - Abnormal; Notable for the following components:      Result Value   Culture   (*)    Value: <10,000 COLONIES/mL INSIGNIFICANT GROWTH Performed at [facility], [address] [place], [place] [address]    All other components within normal limits  POCT URINALYSIS DIP (MANUAL ENTRY) - Abnormal; Notable for the following components:   Clarity, UA cloudy (*)    Blood, UA large (*)    All other components within normal limits  CYTOLOGY, (ORAL, ANAL, URETHRAL) ANCILLARY ONLY   UA negative for infection Likely kidney stone give hematuria and pt reported urinary sediment Penile self-swab obtained.   We will follow up with you regarding abnormal results Declines HIV/ syphilis testing today Prescribed clotrimazole for fungal rash to pubic area If tests results are positive, please abstain from sexual activity until you and your partner(s) have been treated Follow up with PCP or [facility] if symptoms persists Return here or go to ER if you have any new or worsening symptoms fever, chills, nausea, vomiting, abdominal or pelvic pain, painful intercourse, vaginal discharge, vaginal bleeding, persistent symptoms despite treatment Reviewed expectations re: course of current medical issues. Questions answered. Outlined signs and symptoms indicating need for more acute intervention. Patient verbalized understanding. After Visit Summary given.       Moshe Cipro, NP 07/20/20 [id number]

## 2020-11-11 ENCOUNTER — Other Ambulatory Visit: Payer: Self-pay

## 2020-11-11 ENCOUNTER — Ambulatory Visit
Admission: EM | Admit: 2020-11-11 | Discharge: 2020-11-11 | Disposition: A | Discharge status: Home / Self Care | Payer: Medicaid Other | Attending: Emergency Medicine | Admitting: Emergency Medicine

## 2020-11-11 DIAGNOSIS — Z1152 Encounter for screening for COVID-19: Secondary | ICD-10-CM | POA: Insufficient documentation

## 2020-11-11 DIAGNOSIS — Z202 Contact with and (suspected) exposure to infections with a predominantly sexual mode of transmission: Secondary | ICD-10-CM | POA: Diagnosis not present

## 2020-11-11 DIAGNOSIS — J029 Acute pharyngitis, unspecified: Secondary | ICD-10-CM | POA: Diagnosis not present

## 2020-11-11 LAB — POCT RAPID STREP A (OFFICE): Rapid Strep A Screen: NEGATIVE

## 2020-11-11 MED ORDER — AZITHROMYCIN 500 MG PO TABS: 1000.0000 mg | ORAL_TABLET | Freq: Once | ORAL | 0 refills | Status: AC

## 2020-11-11 MED ORDER — CEFTRIAXONE SODIUM 500 MG IJ SOLR
500.0000 mg | Freq: Once | INTRAMUSCULAR | Status: AC
  Administered 2020-11-11: 500 mg via INTRAMUSCULAR

## 2020-11-11 NOTE — ED Triage Notes (Signed)
Provider triage  

## 2020-11-11 NOTE — ED Provider Notes (Signed)
[facility]ARE CENTER   [id number] 11/11/20 Arrival Time: 1254   Chief Complaint  Patient presents with  . Exposure to STD  . Sore Throat     SUBJECTIVE:  Ricky Cook is a 34 y.o. male who presented to the urgent care with a complaint of sore throat for the past 2 days.  Reported he has a recent oral sex as well as symptoms thereafter.  He is not sure if he is Covid or STD related.  Patient is sexually active with multiple male partners.  He is also reporting yellowish-green penile discharge.  Has not tried any OTC medication.  Denies any alleviating or aggravating factors.  Reports similar symptoms in the past..  Denies chills, fever nausea, vomiting, abdominal or pelvic pain, urinary symptoms, vaginal itching, penile bleeding,  rashes or lesions.   He would like to have COVID-19 test completed for rule out.  No LMP for male patient. Current birth control method: Compliant with BC:  ROS: As per HPI.  All other pertinent ROS negative.     Past Medical History:  Diagnosis Date  . Asthma   . Hypertension    No past surgical history on file. Allergies  Allergen Reactions  . Augmentin [Amoxicillin-Pot Clavulanate] Other (See Comments)    thrush   No current facility-administered medications on file prior to encounter.   Current Outpatient Medications on File Prior to Encounter  Medication Sig Dispense Refill  . benazepril-hydrochlorthiazide (LOTENSIN HCT) 20-12.5 MG per tablet Take 1 tablet by mouth daily.    . busPIRone (BUSPAR) 15 MG tablet Take 15 mg by mouth 3 (three) times daily.    . clotrimazole (LOTRIMIN) 1 % cream Apply to affected area 2 times daily 15 g 0  . fexofenadine-pseudoephedrine (ALLEGRA-D) 60-120 MG per tablet Take 1 tablet by mouth daily.    . fluticasone (FLONASE) 50 MCG/ACT nasal spray Place 2 sprays into the nose daily.      Social History   Socioeconomic History  . Marital status: Single    Spouse name: Not on file  . Number of  children: Not on file  . Years of education: Not on file  . Highest education level: Not on file  Occupational History  . Not on file  Tobacco Use  . Smoking status: Current Every Day Smoker    Packs/day: 0.50    Types: Cigarettes  . Smokeless tobacco: Never Used  Substance and Sexual Activity  . Alcohol use: Never  . Drug use: Never  . Sexual activity: Not on file  Other Topics Concern  . Not on file  Social History Narrative  . Not on file   Social Determinants of Health   Financial Resource Strain:   . Difficulty of Paying Living Expenses: Not on file  Food Insecurity:   . Worried About Programme researcher, broadcasting/film/video in the Last Year: Not on file  . Ran Out of Food in the Last Year: Not on file  Transportation Needs:   . Lack of Transportation (Medical): Not on file  . Lack of Transportation (Non-Medical): Not on file  Physical Activity:   . Days of Exercise per Week: Not on file  . Minutes of Exercise per Session: Not on file  Stress:   . Feeling of Stress : Not on file  Social Connections:   . Frequency of Communication with Friends and Family: Not on file  . Frequency of Social Gatherings with Friends and Family: Not on file  . Attends Religious Services: Not  on file  . Active Member of Clubs or Organizations: Not on file  . Attends Banker Meetings: Not on file  . Marital Status: Not on file  Intimate Partner Violence:   . Fear of Current or Ex-Partner: Not on file  . Emotionally Abused: Not on file  . Physically Abused: Not on file  . Sexually Abused: Not on file   No family history on file.  OBJECTIVE:  Vitals:   11/11/20 1302  BP: (!) 168/108  Pulse: 90  Resp: 16  Temp: 99.2 F (37.3 C)  SpO2: 98%     General appearance: Alert, NAD, appears stated age Head: NCAT Throat: lips, mucosa, and tongue normal; teeth and gums normal Lungs: CTA bilaterally without adventitious breath sounds Heart: regular rate and rhythm.  Radial pulses 2+  symmetrical bilaterally Back: no CVA tenderness Abdomen: soft, non-tender; bowel sounds normal; no masses or organomegaly; no guarding or rebound tenderness GU: declines; penile self swab obtained Skin: warm and dry Psychological:  Alert and cooperative. Normal mood and affect.  LABS:  Results for orders placed or performed during the hospital encounter of 11/11/20  POCT rapid strep A  Result Value Ref Range   Rapid Strep A Screen Negative Negative    Labs Reviewed  CULTURE, GROUP A STREP ([facility])  NOVEL CORONAVIRUS, NAA  POCT RAPID STREP A (OFFICE)  CYTOLOGY, (ORAL, ANAL, URETHRAL) ANCILLARY ONLY  CYTOLOGY, (ORAL, ANAL, URETHRAL) ANCILLARY ONLY    ASSESSMENT & PLAN:  1. Encounter for screening for COVID-19   2. Sore throat   3. STD exposure   4. Chlamydia contact, treated   5. Gonorrhea contact, treated     Meds ordered this encounter  Medications  . cefTRIAXone (ROCEPHIN) injection 500 mg  . azithromycin (ZITHROMAX) 500 MG tablet    Sig: Take 2 tablets (1,000 mg total) by mouth once for 1 dose.    Dispense:  2 tablet    Refill:  0    Pending: Labs Reviewed  CULTURE, GROUP A STREP ([facility])  NOVEL CORONAVIRUS, NAA  POCT RAPID STREP A (OFFICE)  CYTOLOGY, (ORAL, ANAL, URETHRAL) ANCILLARY ONLY  CYTOLOGY, (ORAL, ANAL, URETHRAL) ANCILLARY ONLY    Discharge instructions  COVID-19 test was completed.  It will take 2 to 3 days for results to return.  Someone will call you if your result is abnormal.  Strep test is negative.  Sample will be sent for culture and someone will call you if your result is abnormal  Penile self-swab obtained.  We will follow up with you regarding abnormal results Given rocephin 500 mg injection given in office for possible gonorrhea Azithromycin 1 g was prescribed for possible chlamydia Take medications as prescribed and to completion If tests results are positive, please abstain from sexual activity until you and your partner(s) have been  treated Follow up with PCP or [facility] if symptoms persists Return here or go to ER if you have any new or worsening symptoms fever, chills, nausea, vomiting, abdominal or pelvic pain, painful intercourse, penile discharge, penile bleeding, persistent symptoms despite treatment, etc...  Reviewed expectations re: course of current medical issues. Questions answered. Outlined signs and symptoms indicating need for more acute intervention. Patient verbalized understanding. After Visit Summary given.       Durward Parcel, FNP 11/11/20 1339

## 2020-11-11 NOTE — Discharge Instructions (Addendum)
COVID-19 test was completed.  It will take 2 to 3 days for results to return.  Someone will call you if your result is abnormal.  Strep test is negative.  Sample will be sent for culture and someone will call you if your result is abnormal  Penile self-swab obtained.  We will follow up with you regarding abnormal results Given rocephin 500 mg injection given in office for possible gonorrhea Azithromycin 1 g was prescribed for possible chlamydia Take medications as prescribed and to completion If tests results are positive, please abstain from sexual activity until you and your partner(s) have been treated Follow up with PCP or [facility] if symptoms persists Return here or go to ER if you have any new or worsening symptoms fever, chills, nausea, vomiting, abdominal or pelvic pain, painful intercourse, penile discharge, penile bleeding, persistent symptoms despite treatment, etc..Marland Kitchen

## 2020-11-12 LAB — NOVEL CORONAVIRUS, NAA: SARS-CoV-2, NAA: NOT DETECTED

## 2020-11-12 LAB — SARS-COV-2, NAA 2 DAY TAT

## 2020-11-13 ENCOUNTER — Telehealth (HOSPITAL_COMMUNITY): Payer: Self-pay | Admitting: Emergency Medicine

## 2020-11-13 ENCOUNTER — Encounter: Payer: Self-pay | Admitting: Emergency Medicine

## 2020-11-13 LAB — CYTOLOGY, (ORAL, ANAL, URETHRAL) ANCILLARY ONLY
Chlamydia: NEGATIVE
Chlamydia: NEGATIVE
Comment: NEGATIVE
Comment: NEGATIVE
Comment: NORMAL
Comment: NEGATIVE
Comment: NEGATIVE
Comment: NORMAL
Neisseria Gonorrhea: NEGATIVE
Neisseria Gonorrhea: NEGATIVE
Trichomonas: NEGATIVE
Trichomonas: POSITIVE — AB

## 2020-11-13 MED ORDER — METRONIDAZOLE 500 MG PO TABS
500.0000 mg | ORAL_TABLET | Freq: Two times a day (BID) | ORAL | 0 refills | Status: DC
Start: 2020-11-13 — End: 2021-12-23

## 2020-11-14 LAB — CULTURE, GROUP A STREP (THRC)

## 2021-10-15 ENCOUNTER — Other Ambulatory Visit: Payer: Self-pay

## 2021-10-15 ENCOUNTER — Encounter: Payer: Self-pay | Admitting: Emergency Medicine

## 2021-10-15 ENCOUNTER — Ambulatory Visit
Admission: EM | Admit: 2021-10-15 | Discharge: 2021-10-15 | Disposition: A | Discharge status: Home / Self Care | Payer: Medicaid Other

## 2021-10-15 DIAGNOSIS — M542 Cervicalgia: Secondary | ICD-10-CM

## 2021-10-15 DIAGNOSIS — M5412 Radiculopathy, cervical region: Secondary | ICD-10-CM | POA: Diagnosis not present

## 2021-10-15 MED ORDER — TIZANIDINE HCL 4 MG PO TABS: 4.0000 mg | ORAL_TABLET | Freq: Every day | ORAL | 0 refills | Status: DC

## 2021-10-15 MED ORDER — PREDNISONE 20 MG PO TABS: ORAL_TABLET | ORAL | 0 refills | Status: DC

## 2021-10-15 NOTE — ED Triage Notes (Signed)
Patient c/o RT sided shoulder pain and neck x 4 weeks.   Patient denies fall or trauma.   Patient has history of shoulder injury.   Patient has taken muscle relaxants with no relief of symptoms.

## 2021-10-15 NOTE — ED Provider Notes (Signed)
[facility]   MRN: [id number] DOB: 1986-01-10  Subjective:   Ricky Cook is a 35 y.o. male presenting for 4-week history of persistent right-sided neck pain that radiates into his trapezius right shoulder and right upper arm.  Has a history of severe neck injuries, traumas.  Not recently.  He did have a recent visit and had imaging done which was negative.  Has been using muscle relaxants for a while without any current relief.  Has not seen a spine specialist.  No current facility-administered medications for this encounter.  Current Outpatient Medications:    amLODipine (NORVASC) 5 MG tablet, Take 5 mg by mouth daily., Disp: , Rfl:    losartan (COZAAR) 100 MG tablet, Take 100 mg by mouth daily., Disp: , Rfl:    benazepril-hydrochlorthiazide (LOTENSIN HCT) 20-12.5 MG per tablet, Take 1 tablet by mouth daily., Disp: , Rfl:    busPIRone (BUSPAR) 15 MG tablet, Take 15 mg by mouth 3 (three) times daily., Disp: , Rfl:    clotrimazole (LOTRIMIN) 1 % cream, Apply to affected area 2 times daily, Disp: 15 g, Rfl: 0   fexofenadine-pseudoephedrine (ALLEGRA-D) 60-120 MG per tablet, Take 1 tablet by mouth daily., Disp: , Rfl:    fluticasone (FLONASE) 50 MCG/ACT nasal spray, Place 2 sprays into the nose daily., Disp: , Rfl:    metroNIDAZOLE (FLAGYL) 500 MG tablet, Take 1 tablet (500 mg total) by mouth 2 (two) times daily., Disp: 14 tablet, Rfl: 0   Allergies  Allergen Reactions   Ceftin [Cefuroxime Axetil] Shortness Of Breath   Augmentin [Amoxicillin-Pot Clavulanate] Other (See Comments)    thrush    Past Medical History:  Diagnosis Date   Asthma    Hypertension      History reviewed. No pertinent surgical history.  History reviewed. No pertinent family history.  Social History   Tobacco Use   Smoking status: Every Day    Packs/day: 0.50    Types: Cigarettes   Smokeless tobacco: Never  Substance Use Topics   Alcohol use: Never   Drug use: Never     ROS   Objective:   Vitals: BP 129/87 (BP Location: Right Arm)   Pulse 81   Temp 98.6 F (37 C) (Oral)   Resp 16   SpO2 97%   Physical Exam Constitutional:      General: He is not in acute distress.    Appearance: Normal appearance. He is well-developed and normal weight. He is not ill-appearing, toxic-appearing or diaphoretic.  HENT:     Head: Normocephalic and atraumatic.     Right Ear: External ear normal.     Left Ear: External ear normal.     Nose: Nose normal.     Mouth/Throat:     Pharynx: Oropharynx is clear.  Eyes:     General: No scleral icterus.       Right eye: No discharge.        Left eye: No discharge.     Extraocular Movements: Extraocular movements intact.     Pupils: Pupils are equal, round, and reactive to light.  Cardiovascular:     Rate and Rhythm: Normal rate.  Pulmonary:     Effort: Pulmonary effort is normal.  Musculoskeletal:     Cervical back: Spasms and tenderness (positive Spurling maneuver) present. No swelling, edema, deformity, erythema, signs of trauma, lacerations, rigidity, torticollis, bony tenderness or crepitus. Pain with movement present. Decreased range of motion.  Neurological:     Mental Status: He is alert  and oriented to person, place, and time.  Psychiatric:        Mood and Affect: Mood normal.        Behavior: Behavior normal.        Thought Content: Thought content normal.        Judgment: Judgment normal.    Narrative  10/07/2021 11:42 PM EDT   CLINICAL DATA:  Right neck pain radiating to right shoulder for 3 weeks  EXAM: DG SHOULDER 3+ VIEWS RIGHT  COMPARISON:  09/08/2021  FINDINGS: Frontal and transscapular views of the right shoulder are obtained. No fracture, subluxation, or dislocation. Joint spaces are well preserved. Right chest is clear.      Imaging Results - XR Shoulder 3 Or More Views Right (10/07/2021 11:36 PM EDT) Procedure Note  Hinton Dyer, MD - 10/07/2021   Formatting of this  note might be different from the original. CLINICAL DATA: Right neck pain radiating to right shoulder for 3 weeks  EXAM: DG SHOULDER 3+ VIEWS RIGHT  COMPARISON: 09/08/2021  FINDINGS: Frontal and transscapular views of the right shoulder are obtained. No fracture, subluxation, or dislocation. Joint spaces are well preserved. Right chest is clear.  IMPRESSION: 1. Unremarkable right shoulder.   Electronically Signed By: Sharlet Salina M.D. On: 10/07/2021 23:42    Imaging Results - XR Shoulder 3 Or More Views Right (10/07/2021 11:36 PM EDT) Authorizing Provider Result Type  Venita Lick MD IMG DIAGNOSTIC IMAGING ORDERABLES   Back to top of Imaging Results    XR Cervical Spine Complete 4 Or 5 Views (10/07/2021 11:36 PM EDT) Imaging Results - XR Cervical Spine Complete 4 Or 5 Views (10/07/2021 11:36 PM EDT) Anatomical Region Laterality Modality  C-spine   Computed Radiography   Imaging Results - XR Cervical Spine Complete 4 Or 5 Views (10/07/2021 11:36 PM EDT) Specimen (Source) Anatomical Location / Laterality Collection Method / Volume Collection Time Received Time        10/07/2021 11:40 PM EDT     Imaging Results - XR Cervical Spine Complete 4 Or 5 Views (10/07/2021 11:36 PM EDT) Impressions  10/07/2021 11:43 PM EDT   Negative cervical spine radiographs.   Electronically Signed  By: Wiliam Ke M.D.  On: 10/07/2021 23:43      Imaging Results - XR Cervical Spine Complete 4 Or 5 Views (10/07/2021 11:36 PM EDT) Narrative  10/07/2021 11:43 PM EDT   CLINICAL DATA:  Right neck pain  EXAM: CERVICAL SPINE - COMPLETE 4+ VIEW  COMPARISON:  No prior exam is available for review.  FINDINGS: The cervical spine is visualized from the skull base through T1. There is no evidence of cervical spine fracture or prevertebral soft tissue swelling. Mild straightening of the normal cervical lordosis, which may be positional. Alignment is otherwise unremarkable.  Disc height loss with osteophyte formation at C5-C6.      Imaging Results - XR Cervical Spine Complete 4 Or 5 Views (10/07/2021 11:36 PM EDT) Procedure Note  Myra Gianotti, MD - 10/07/2021   Formatting of this note might be different from the original. CLINICAL DATA: Right neck pain  EXAM: CERVICAL SPINE - COMPLETE 4+ VIEW  COMPARISON: No prior exam is available for review.  FINDINGS: The cervical spine is visualized from the skull base through T1. There is no evidence of cervical spine fracture or prevertebral soft tissue swelling. Mild straightening of the normal cervical lordosis, which may be positional. Alignment is otherwise unremarkable. Disc height loss with osteophyte formation at C5-C6.  IMPRESSION: Negative cervical spine radiographs.   Electronically Signed By: Wiliam Ke M.D. On: 10/07/2021 23:43    Assessment and Plan :   PDMP not reviewed this encounter.  1. Cervical radiculopathy   2. Neck pain    Physical exam consistent with cervical radiculopathy and will manage with prednisone course. Follow up with spine specialist. Counseled patient on potential for adverse effects with medications prescribed/recommended today, ER and return-to-clinic precautions discussed, patient verbalized understanding.    Wallis Bamberg, PA-C 10/15/21 1747

## 2021-11-01 ENCOUNTER — Other Ambulatory Visit: Payer: Self-pay | Admitting: Neurosurgery

## 2021-11-01 DIAGNOSIS — M5412 Radiculopathy, cervical region: Secondary | ICD-10-CM

## 2021-11-15 ENCOUNTER — Other Ambulatory Visit: Payer: Medicaid Other

## 2021-11-15 ENCOUNTER — Ambulatory Visit
Admission: RE | Admit: 2021-11-15 | Discharge: 2021-11-15 | Disposition: A | Discharge status: Home / Self Care | Payer: Medicaid Other | Source: Ambulatory Visit | Attending: Neurosurgery | Admitting: Neurosurgery

## 2021-11-15 ENCOUNTER — Other Ambulatory Visit: Payer: Self-pay

## 2021-11-15 DIAGNOSIS — M5412 Radiculopathy, cervical region: Secondary | ICD-10-CM

## 2021-11-29 ENCOUNTER — Other Ambulatory Visit: Payer: Self-pay | Admitting: Neurosurgery

## 2021-12-14 NOTE — Progress Notes (Signed)
Surgical Instructions    Your procedure is scheduled on 12/21/21.  Report to [facility] Main Entrance "A" at 6:00 A.M., then check in with the Admitting office.  Call this number if you have problems the morning of surgery:  [phone]   If you have any questions prior to your surgery date call [phone]: Open Monday-Friday 8am-4pm    Remember:  Do not eat after midnight the night before your surgery  You may drink clear liquids until 5:00 the morning of your surgery.   Clear liquids allowed are: Water, Non-Citrus Juices (without pulp), Carbonated Beverages, Clear Tea, Black Coffee ONLY (NO MILK, CREAM OR POWDERED CREAMER of any kind), and Gatorade    Take these medicines the morning of surgery with A SIP OF WATER:  amLODipine (NORVASC) cetirizine (ZYRTEC)  famotidine (PEPCID)  albuterol (VENTOLIN HFA) - if needed   As of today, STOP taking any Aspirin (unless otherwise instructed by your surgeon) Aleve, Naproxen, Ibuprofen, Motrin, Advil, Goody's, BC's, all herbal medications, fish oil, and all vitamins.   After your COVID test   You are not required to quarantine however you are required to wear a well-fitting mask when you are out and around people not in your household.  If your mask becomes wet or soiled, replace with a new one.  Wash your hands often with soap and water for 20 seconds or clean your hands with an alcohol-based hand sanitizer that contains at least 60% alcohol.  Do not share personal items.  Notify your provider: if you are in close contact with someone who has COVID  or if you develop a fever of 100.4 or greater, sneezing, cough, sore throat, shortness of breath or body aches.           Do not wear jewelry  Do not wear lotions, powders, colognes, or deodorant. Do not shave 48 hours prior to surgery.  Men may shave face and neck. Do not bring valuables to the hospital.              [facility] is not responsible for any belongings or  valuables.  Do NOT Smoke (Tobacco/Vaping)  24 hours prior to your procedure  If you use a CPAP at night, you may bring your mask for your overnight stay.   Contacts, glasses, hearing aids, dentures or partials may not be worn into surgery, please bring cases for these belongings   For patients admitted to the hospital, discharge time will be determined by your treatment team.   Patients discharged the day of surgery will not be allowed to drive home, and someone needs to stay with them for 24 hours.  NO VISITORS WILL BE ALLOWED IN PRE-OP WHERE PATIENTS ARE PREPPED FOR SURGERY.  ONLY 1 SUPPORT PERSON MAY BE PRESENT IN THE WAITING ROOM WHILE YOU ARE IN SURGERY.  IF YOU ARE TO BE ADMITTED, ONCE YOU ARE IN YOUR ROOM YOU WILL BE ALLOWED TWO (2) VISITORS. 1 (ONE) VISITOR MAY STAY OVERNIGHT BUT MUST ARRIVE TO THE ROOM BY 8pm.  Minor children may have two parents present. Special consideration for safety and communication needs will be reviewed on a case by case basis.  Special instructions:    Oral Hygiene is also important to reduce your risk of infection.  Remember - BRUSH YOUR TEETH THE MORNING OF SURGERY WITH YOUR REGULAR TOOTHPASTE   [facility]- Preparing For Surgery  Before surgery, you can play an important role. Because skin is not sterile, your skin needs to be  as free of germs as possible. You can reduce the number of germs on your skin by washing with CHG (chlorahexidine gluconate) Soap before surgery.  CHG is an antiseptic cleaner which kills germs and bonds with the skin to continue killing germs even after washing.     Please do not use if you have an allergy to CHG or antibacterial soaps. If your skin becomes reddened/irritated stop using the CHG.  Do not shave (including legs and underarms) for at least 48 hours prior to first CHG shower. It is OK to shave your face.  Please follow these instructions carefully.     Shower the NIGHT BEFORE SURGERY and the MORNING OF SURGERY  with CHG Soap.   If you chose to wash your hair, wash your hair first as usual with your normal shampoo. After you shampoo, rinse your hair and body thoroughly to remove the shampoo.  Then [employer] and genitals (private parts) with your normal soap and rinse thoroughly to remove soap.  After that Use CHG Soap as you would any other liquid soap. You can apply CHG directly to the skin and wash gently with a scrungie or a clean washcloth.   Apply the CHG Soap to your body ONLY FROM THE NECK DOWN.  Do not use on open wounds or open sores. Avoid contact with your eyes, ears, mouth and genitals (private parts). Wash Face and genitals (private parts)  with your normal soap.   Wash thoroughly, paying special attention to the area where your surgery will be performed.  Thoroughly rinse your body with warm water from the neck down.  DO NOT shower/wash with your normal soap after using and rinsing off the CHG Soap.  Pat yourself dry with a CLEAN TOWEL.  Wear CLEAN PAJAMAS to bed the night before surgery  Place CLEAN SHEETS on your bed the night before your surgery  DO NOT SLEEP WITH PETS.   Day of Surgery:  Take a shower with CHG soap. Wear Clean/Comfortable clothing the morning of surgery Do not apply any deodorants/lotions.   Remember to brush your teeth WITH YOUR REGULAR TOOTHPASTE.   Please read over the following fact sheets that you were given.

## 2021-12-15 ENCOUNTER — Encounter (HOSPITAL_COMMUNITY): Payer: Self-pay

## 2021-12-15 ENCOUNTER — Other Ambulatory Visit: Payer: Self-pay

## 2021-12-15 ENCOUNTER — Encounter (HOSPITAL_COMMUNITY)
Admission: RE | Admit: 2021-12-15 | Discharge: 2021-12-15 | Disposition: A | Discharge status: Home / Self Care | Payer: Medicaid Other | Source: Ambulatory Visit | Attending: Neurosurgery | Admitting: Neurosurgery

## 2021-12-15 VITALS — BP 159/97 | HR 79 | Temp 98.4°F | Resp 19 | Ht 71.5 in | Wt 266.0 lb

## 2021-12-15 DIAGNOSIS — Z01818 Encounter for other preprocedural examination: Secondary | ICD-10-CM | POA: Insufficient documentation

## 2021-12-15 HISTORY — DX: Gastro-esophageal reflux disease without esophagitis: K21.9

## 2021-12-15 LAB — BASIC METABOLIC PANEL
Anion gap: 7 (ref 5–15)
BUN: 11 mg/dL (ref 6–20)
CO2: 28 mmol/L (ref 22–32)
Calcium: 9.2 mg/dL (ref 8.9–10.3)
Chloride: 104 mmol/L (ref 98–111)
Creatinine, Ser: 1.13 mg/dL (ref 0.61–1.24)
GFR, Estimated: >60 mL/min (ref >60)
Glucose, Bld: 89 mg/dL (ref 70–99)
Potassium: 4 mmol/L (ref 3.5–5.1)
Sodium: 139 mmol/L (ref 135–145)

## 2021-12-15 LAB — CBC
HCT: 45.9 % (ref 39.0–52.0)
Hemoglobin: 14.9 g/dL (ref 13.0–17.0)
MCH: 28.4 pg (ref 26.0–34.0)
MCHC: 32.5 g/dL (ref 30.0–36.0)
MCV: 87.4 fL (ref 80.0–100.0)
Platelets: 250 10*3/uL (ref 150–400)
RBC: 5.25 MIL/uL (ref 4.22–5.81)
RDW: 13.7 % (ref 11.5–15.5)
WBC: 6.2 10*3/uL (ref 4.0–10.5)
nRBC: 0 % (ref 0.0–0.2)

## 2021-12-15 LAB — SURGICAL PCR SCREEN
MRSA, PCR: NEGATIVE
Staphylococcus aureus: NEGATIVE

## 2021-12-15 NOTE — Progress Notes (Signed)
PCP: denies Cardiologist: denies  EKG: 12/15/21 CXR: 09/09/21 CE ECHO: denies Stress Test: denies Cardiac Cath: denies  Fasting Blood Sugar- na Checks Blood Sugar__na_ times a day  OSA/CPAP: No  ASA/Blood Thinner: No  Covid test scheduled 12/30 at 1345  Anesthesia Review: No  Patient denies shortness of breath, fever, cough, and chest pain at PAT appointment.  Patient verbalized understanding of instructions provided today at the PAT appointment.  Patient asked to review instructions at home and day of surgery.

## 2021-12-16 NOTE — Progress Notes (Signed)
Per blood bank, patient needs a repeat type and screen due to antibodies.

## 2021-12-17 ENCOUNTER — Other Ambulatory Visit (HOSPITAL_COMMUNITY): Admission: RE | Admit: 2021-12-17 | Payer: Medicaid Other | Source: Ambulatory Visit

## 2021-12-17 ENCOUNTER — Other Ambulatory Visit (HOSPITAL_COMMUNITY)
Admission: RE | Admit: 2021-12-17 | Discharge: 2021-12-17 | Disposition: A | Discharge status: Home / Self Care | Payer: Medicaid Other | Source: Ambulatory Visit | Attending: Neurosurgery | Admitting: Neurosurgery

## 2021-12-17 DIAGNOSIS — Z01812 Encounter for preprocedural laboratory examination: Secondary | ICD-10-CM | POA: Diagnosis not present

## 2021-12-17 DIAGNOSIS — U071 COVID-19: Secondary | ICD-10-CM | POA: Insufficient documentation

## 2021-12-17 DIAGNOSIS — Z01818 Encounter for other preprocedural examination: Secondary | ICD-10-CM

## 2021-12-17 LAB — SARS CORONAVIRUS 2 (TAT 6-24 HRS): SARS Coronavirus 2: POSITIVE — AB

## 2021-12-18 ENCOUNTER — Telehealth: Payer: Self-pay | Admitting: Student

## 2021-12-18 NOTE — Progress Notes (Signed)
Called Dr. Jordan Likes 's office , Hildred Priest NP, returned called, informed of pt's positive covid test.

## 2021-12-18 NOTE — Telephone Encounter (Signed)
Spoke with patient regarding positive COVID test performed on 12/17/2021 for his preop testing. The patient is asymptomatic. He denies fever, cough, shortness of breath, sore throat, general malaise, or loss of appetite. Patient informed that we will move forward with scheduled C4-5, C5-6 ACDF surgery as planned on 12/21/2021 with Dr. Jordan Likes.

## 2021-12-18 NOTE — Progress Notes (Signed)
Left a voice message explaining that since he tested positive for covid that he has to call to Short Stay [phone] when arrives at the entrance. There can be no visitors waiting in the waiting area while he is having surgery, and once he is in his room, his visitors may not switch out.

## 2021-12-20 NOTE — Anesthesia Preprocedure Evaluation (Addendum)
Anesthesia Evaluation  Patient identified by MRN, date of birth, ID band Patient awake    Reviewed: Allergy & Precautions, NPO status , Patient's Chart, lab work & pertinent test results  Airway Mallampati: II  TM Distance: >3 FB Neck ROM: Full    Dental  (+) Teeth Intact, Dental Advisory Given   Pulmonary asthma , former smoker,  COVID +, no symptoms per patient    Pulmonary exam normal breath sounds clear to auscultation       Cardiovascular hypertension, Pt. on medications Normal cardiovascular exam Rhythm:Regular Rate:Normal     Neuro/Psych Cervical stenosis negative psych ROS   GI/Hepatic Neg liver ROS, GERD  Medicated,  Endo/Other  Obesity   Renal/GU negative Renal ROS     Musculoskeletal negative musculoskeletal ROS (+)   Abdominal   Peds  Hematology negative hematology ROS (+)   Anesthesia Other Findings   Reproductive/Obstetrics                            Anesthesia Physical Anesthesia Plan  ASA: 2  Anesthesia Plan: General   Post-op Pain Management: Tylenol PO (pre-op)   Induction: Intravenous  PONV Risk Score and Plan: 2 and Midazolam, Dexamethasone and Ondansetron  Airway Management Planned: Oral ETT and Video Laryngoscope Planned  Additional Equipment:   Intra-op Plan:   Post-operative Plan: Extubation in OR  Informed Consent: I have reviewed the patients History and Physical, chart, labs and discussed the procedure including the risks, benefits and alternatives for the proposed anesthesia with the patient or authorized representative who has indicated his/her understanding and acceptance.     Dental advisory given  Plan Discussed with: CRNA  Anesthesia Plan Comments:        Anesthesia Quick Evaluation

## 2021-12-21 ENCOUNTER — Encounter (HOSPITAL_COMMUNITY): Payer: Self-pay | Admitting: Neurosurgery

## 2021-12-21 ENCOUNTER — Inpatient Hospital Stay (HOSPITAL_COMMUNITY)
Admission: AD | Admit: 2021-12-21 | Discharge: 2021-12-23 | DRG: 471 | Disposition: A | Discharge status: Home / Self Care | Payer: Medicaid Other | Attending: Neurosurgery | Admitting: Neurosurgery

## 2021-12-21 ENCOUNTER — Encounter (HOSPITAL_COMMUNITY)
Admission: AD | Disposition: A | Discharge status: Home / Self Care | Payer: Self-pay | Source: Home / Self Care | Attending: Neurosurgery

## 2021-12-21 ENCOUNTER — Ambulatory Visit (HOSPITAL_COMMUNITY): Payer: Medicaid Other

## 2021-12-21 ENCOUNTER — Ambulatory Visit (HOSPITAL_COMMUNITY): Payer: Medicaid Other | Admitting: Anesthesiology

## 2021-12-21 ENCOUNTER — Other Ambulatory Visit: Payer: Self-pay

## 2021-12-21 ENCOUNTER — Observation Stay (HOSPITAL_COMMUNITY): Payer: Medicaid Other | Admitting: Anesthesiology

## 2021-12-21 ENCOUNTER — Inpatient Hospital Stay (HOSPITAL_COMMUNITY)
Admission: AD | Disposition: A | Discharge status: Home / Self Care | Payer: Self-pay | Source: Home / Self Care | Attending: Neurosurgery

## 2021-12-21 DIAGNOSIS — M4712 Other spondylosis with myelopathy, cervical region: Principal | ICD-10-CM

## 2021-12-21 DIAGNOSIS — Z79899 Other long term (current) drug therapy: Secondary | ICD-10-CM

## 2021-12-21 DIAGNOSIS — M50022 Cervical disc disorder at C5-C6 level with myelopathy: Secondary | ICD-10-CM | POA: Diagnosis present

## 2021-12-21 DIAGNOSIS — L7632 Postprocedural hematoma of skin and subcutaneous tissue following other procedure: Secondary | ICD-10-CM | POA: Diagnosis not present

## 2021-12-21 DIAGNOSIS — J9690 Respiratory failure, unspecified, unspecified whether with hypoxia or hypercapnia: Secondary | ICD-10-CM

## 2021-12-21 DIAGNOSIS — M4722 Other spondylosis with radiculopathy, cervical region: Secondary | ICD-10-CM | POA: Diagnosis present

## 2021-12-21 DIAGNOSIS — U071 COVID-19: Secondary | ICD-10-CM | POA: Diagnosis present

## 2021-12-21 DIAGNOSIS — J45909 Unspecified asthma, uncomplicated: Secondary | ICD-10-CM | POA: Diagnosis present

## 2021-12-21 DIAGNOSIS — S1093XA Contusion of unspecified part of neck, initial encounter: Secondary | ICD-10-CM

## 2021-12-21 DIAGNOSIS — Z880 Allergy status to penicillin: Secondary | ICD-10-CM

## 2021-12-21 DIAGNOSIS — K219 Gastro-esophageal reflux disease without esophagitis: Secondary | ICD-10-CM | POA: Diagnosis present

## 2021-12-21 DIAGNOSIS — I10 Essential (primary) hypertension: Secondary | ICD-10-CM | POA: Diagnosis present

## 2021-12-21 DIAGNOSIS — M50121 Cervical disc disorder at C4-C5 level with radiculopathy: Secondary | ICD-10-CM | POA: Diagnosis present

## 2021-12-21 DIAGNOSIS — M50122 Cervical disc disorder at C5-C6 level with radiculopathy: Secondary | ICD-10-CM | POA: Diagnosis present

## 2021-12-21 DIAGNOSIS — Z8249 Family history of ischemic heart disease and other diseases of the circulatory system: Secondary | ICD-10-CM

## 2021-12-21 DIAGNOSIS — Z881 Allergy status to other antibiotic agents status: Secondary | ICD-10-CM

## 2021-12-21 DIAGNOSIS — M4802 Spinal stenosis, cervical region: Secondary | ICD-10-CM | POA: Diagnosis present

## 2021-12-21 DIAGNOSIS — Z419 Encounter for procedure for purposes other than remedying health state, unspecified: Secondary | ICD-10-CM

## 2021-12-21 DIAGNOSIS — M50021 Cervical disc disorder at C4-C5 level with myelopathy: Secondary | ICD-10-CM | POA: Diagnosis present

## 2021-12-21 DIAGNOSIS — Y838 Other surgical procedures as the cause of abnormal reaction of the patient, or of later complication, without mention of misadventure at the time of the procedure: Secondary | ICD-10-CM | POA: Diagnosis not present

## 2021-12-21 DIAGNOSIS — R061 Stridor: Secondary | ICD-10-CM | POA: Diagnosis not present

## 2021-12-21 DIAGNOSIS — F17210 Nicotine dependence, cigarettes, uncomplicated: Secondary | ICD-10-CM | POA: Diagnosis present

## 2021-12-21 DIAGNOSIS — Z882 Allergy status to sulfonamides status: Secondary | ICD-10-CM

## 2021-12-21 HISTORY — PX: ANTERIOR CERVICAL DECOMP/DISCECTOMY FUSION: SHX1161

## 2021-12-21 LAB — TYPE AND SCREEN
ABO/RH(D): O POS
Antibody Screen: POSITIVE

## 2021-12-21 SURGERY — ANTERIOR CERVICAL DECOMPRESSION/DISCECTOMY FUSION 1 LEVEL
Anesthesia: General | Site: Spine Cervical

## 2021-12-21 SURGERY — ANTERIOR CERVICAL DECOMPRESSION/DISCECTOMY FUSION 2 LEVELS
Anesthesia: General

## 2021-12-21 MED ORDER — 0.9 % SODIUM CHLORIDE (POUR BTL) OPTIME
TOPICAL | Status: DC | PRN
Start: 2021-12-21 — End: 2021-12-21
  Administered 2021-12-21: 1000 mL

## 2021-12-21 MED ORDER — PHENYLEPHRINE HCL-NACL 20-0.9 MG/250ML-% IV SOLN
INTRAVENOUS | Status: DC | PRN
  Administered 2021-12-21: 20 ug/min via INTRAVENOUS

## 2021-12-21 MED ORDER — ACETAMINOPHEN 500 MG PO TABS
1000.0000 mg | ORAL_TABLET | Freq: Once | ORAL | Status: AC
  Administered 2021-12-21: 1000 mg via ORAL
  Filled 2021-12-21: qty 2

## 2021-12-21 MED ORDER — FENTANYL CITRATE (PF) 250 MCG/5ML IJ SOLN
INTRAMUSCULAR | Status: AC
  Filled 2021-12-21: qty 5

## 2021-12-21 MED ORDER — DEXAMETHASONE SODIUM PHOSPHATE 10 MG/ML IJ SOLN
INTRAMUSCULAR | Status: DC | PRN
  Administered 2021-12-21: 10 mg via INTRAVENOUS

## 2021-12-21 MED ORDER — PHENYLEPHRINE 40 MCG/ML (10ML) SYRINGE FOR IV PUSH (FOR BLOOD PRESSURE SUPPORT)
PREFILLED_SYRINGE | INTRAVENOUS | Status: DC | PRN
  Administered 2021-12-21 (×3): 40 ug via INTRAVENOUS

## 2021-12-21 MED ORDER — LACTATED RINGERS IV SOLN: INTRAVENOUS | Status: DC | PRN

## 2021-12-21 MED ORDER — ACETAMINOPHEN 650 MG RE SUPP: 650.0000 mg | RECTAL | Status: DC | PRN

## 2021-12-21 MED ORDER — SUGAMMADEX SODIUM 200 MG/2ML IV SOLN
INTRAVENOUS | Status: DC | PRN
Start: 2021-12-21 — End: 2021-12-21
  Administered 2021-12-21: 300 mg via INTRAVENOUS

## 2021-12-21 MED ORDER — LABETALOL HCL 5 MG/ML IV SOLN
INTRAVENOUS | Status: AC
  Filled 2021-12-21: qty 4

## 2021-12-21 MED ORDER — VANCOMYCIN HCL IN DEXTROSE 1-5 GM/200ML-% IV SOLN
1000.0000 mg | INTRAVENOUS | Status: AC
  Administered 2021-12-21: 1000 mg via INTRAVENOUS
  Filled 2021-12-21: qty 200

## 2021-12-21 MED ORDER — ORAL CARE MOUTH RINSE: 15.0000 mL | Freq: Once | OROMUCOSAL | Status: AC

## 2021-12-21 MED ORDER — ACETAMINOPHEN 325 MG PO TABS: 650.0000 mg | ORAL_TABLET | ORAL | Status: DC | PRN

## 2021-12-21 MED ORDER — THROMBIN 5000 UNITS EX SOLR
CUTANEOUS | Status: AC
  Filled 2021-12-21: qty 10000

## 2021-12-21 MED ORDER — CHLORHEXIDINE GLUCONATE 0.12 % MT SOLN: 15.0000 mL | Freq: Once | OROMUCOSAL | Status: AC

## 2021-12-21 MED ORDER — AMLODIPINE BESYLATE 10 MG PO TABS
10.0000 mg | ORAL_TABLET | Freq: Every day | ORAL | Status: DC
  Administered 2021-12-21: 10 mg via ORAL
  Filled 2021-12-21: qty 1

## 2021-12-21 MED ORDER — CEFAZOLIN SODIUM-DEXTROSE 2-3 GM-%(50ML) IV SOLR
INTRAVENOUS | Status: DC | PRN
  Administered 2021-12-21: 2 g via INTRAVENOUS

## 2021-12-21 MED ORDER — LIDOCAINE 2% (20 MG/ML) 5 ML SYRINGE
INTRAMUSCULAR | Status: DC | PRN
  Administered 2021-12-21: 100 mg via INTRAVENOUS

## 2021-12-21 MED ORDER — HYDROCODONE-ACETAMINOPHEN 5-325 MG PO TABS
1.0000 | ORAL_TABLET | ORAL | Status: DC | PRN
  Administered 2021-12-21 – 2021-12-23 (×6): 1 via ORAL
  Filled 2021-12-21 (×6): qty 1

## 2021-12-21 MED ORDER — LOSARTAN POTASSIUM 50 MG PO TABS
100.0000 mg | ORAL_TABLET | Freq: Every day | ORAL | Status: DC
  Administered 2021-12-21: 100 mg via ORAL
  Filled 2021-12-21: qty 2

## 2021-12-21 MED ORDER — CYCLOBENZAPRINE HCL 10 MG PO TABS
10.0000 mg | ORAL_TABLET | Freq: Three times a day (TID) | ORAL | Status: DC | PRN
  Administered 2021-12-21 – 2021-12-22 (×2): 10 mg via ORAL
  Filled 2021-12-21: qty 1

## 2021-12-21 MED ORDER — ONDANSETRON HCL 4 MG PO TABS: 4.0000 mg | ORAL_TABLET | Freq: Four times a day (QID) | ORAL | Status: DC | PRN

## 2021-12-21 MED ORDER — CHLORHEXIDINE GLUCONATE CLOTH 2 % EX PADS: 6.0000 | MEDICATED_PAD | Freq: Once | CUTANEOUS | Status: DC

## 2021-12-21 MED ORDER — FAMOTIDINE 20 MG PO TABS
20.0000 mg | ORAL_TABLET | Freq: Every day | ORAL | Status: DC
  Administered 2021-12-21 – 2021-12-23 (×2): 20 mg via ORAL
  Filled 2021-12-21 (×3): qty 1

## 2021-12-21 MED ORDER — THROMBIN 5000 UNITS EX SOLR
CUTANEOUS | Status: DC | PRN
  Administered 2021-12-21: 20000 [IU] via TOPICAL

## 2021-12-21 MED ORDER — LORATADINE 10 MG PO TABS
10.0000 mg | ORAL_TABLET | Freq: Every day | ORAL | Status: DC
  Administered 2021-12-21 – 2021-12-23 (×2): 10 mg via ORAL
  Filled 2021-12-21 (×3): qty 1

## 2021-12-21 MED ORDER — VANCOMYCIN HCL IN DEXTROSE 1-5 GM/200ML-% IV SOLN
1000.0000 mg | Freq: Once | INTRAVENOUS | Status: AC
  Administered 2021-12-21: 1000 mg via INTRAVENOUS
  Filled 2021-12-21 (×2): qty 200

## 2021-12-21 MED ORDER — SODIUM CHLORIDE 0.9 % IV SOLN
250.0000 mL | INTRAVENOUS | Status: DC
  Administered 2021-12-21: 250 mL via INTRAVENOUS

## 2021-12-21 MED ORDER — SODIUM CHLORIDE 0.9% FLUSH
3.0000 mL | Freq: Two times a day (BID) | INTRAVENOUS | Status: DC
  Administered 2021-12-21 – 2021-12-23 (×2): 3 mL via INTRAVENOUS

## 2021-12-21 MED ORDER — ONDANSETRON HCL 4 MG/2ML IJ SOLN
INTRAMUSCULAR | Status: DC | PRN
  Administered 2021-12-21: 4 mg via INTRAVENOUS

## 2021-12-21 MED ORDER — SODIUM CHLORIDE 0.9% FLUSH: 3.0000 mL | INTRAVENOUS | Status: DC | PRN

## 2021-12-21 MED ORDER — FENTANYL CITRATE (PF) 100 MCG/2ML IJ SOLN
INTRAMUSCULAR | Status: AC
  Filled 2021-12-21: qty 4

## 2021-12-21 MED ORDER — PHENOL 1.4 % MT LIQD: 1.0000 | OROMUCOSAL | Status: DC | PRN

## 2021-12-21 MED ORDER — MENTHOL 3 MG MT LOZG
1.0000 | LOZENGE | OROMUCOSAL | Status: DC | PRN
  Filled 2021-12-21: qty 9

## 2021-12-21 MED ORDER — PROPOFOL 10 MG/ML IV BOLUS
INTRAVENOUS | Status: AC
  Filled 2021-12-21: qty 20

## 2021-12-21 MED ORDER — ROCURONIUM BROMIDE 10 MG/ML (PF) SYRINGE
PREFILLED_SYRINGE | INTRAVENOUS | Status: AC
  Filled 2021-12-21: qty 10

## 2021-12-21 MED ORDER — LIDOCAINE 2% (20 MG/ML) 5 ML SYRINGE
INTRAMUSCULAR | Status: AC
  Filled 2021-12-21: qty 5

## 2021-12-21 MED ORDER — THROMBIN 5000 UNITS EX SOLR
OROMUCOSAL | Status: DC | PRN
  Administered 2021-12-21: 5 mL via TOPICAL

## 2021-12-21 MED ORDER — CHLORHEXIDINE GLUCONATE 0.12 % MT SOLN
OROMUCOSAL | Status: AC
  Administered 2021-12-21: 15 mL via OROMUCOSAL
  Filled 2021-12-21: qty 15

## 2021-12-21 MED ORDER — ROCURONIUM BROMIDE 10 MG/ML (PF) SYRINGE
PREFILLED_SYRINGE | INTRAVENOUS | Status: DC | PRN
  Administered 2021-12-21: 10 mg via INTRAVENOUS
  Administered 2021-12-21: 30 mg via INTRAVENOUS
  Administered 2021-12-21: 60 mg via INTRAVENOUS
  Administered 2021-12-21: 20 mg via INTRAVENOUS

## 2021-12-21 MED ORDER — PROPOFOL 10 MG/ML IV BOLUS
INTRAVENOUS | Status: DC | PRN
  Administered 2021-12-21: 200 mg via INTRAVENOUS

## 2021-12-21 MED ORDER — PROMETHAZINE HCL 25 MG/ML IJ SOLN: 6.2500 mg | INTRAMUSCULAR | Status: DC | PRN

## 2021-12-21 MED ORDER — DEXAMETHASONE SODIUM PHOSPHATE 10 MG/ML IJ SOLN
INTRAMUSCULAR | Status: AC
  Filled 2021-12-21: qty 1

## 2021-12-21 MED ORDER — HYDROMORPHONE HCL 1 MG/ML IJ SOLN
1.0000 mg | INTRAMUSCULAR | Status: DC | PRN
  Administered 2021-12-22 (×2): 1 mg via INTRAVENOUS
  Filled 2021-12-21 (×3): qty 1

## 2021-12-21 MED ORDER — ONDANSETRON HCL 4 MG/2ML IJ SOLN
INTRAMUSCULAR | Status: AC
  Filled 2021-12-21: qty 2

## 2021-12-21 MED ORDER — MIDAZOLAM HCL 2 MG/2ML IJ SOLN
INTRAMUSCULAR | Status: DC | PRN
  Administered 2021-12-21: 2 mg via INTRAVENOUS

## 2021-12-21 MED ORDER — HYDROCODONE-ACETAMINOPHEN 10-325 MG PO TABS
2.0000 | ORAL_TABLET | ORAL | Status: DC | PRN
  Administered 2021-12-21: 2 via ORAL
  Filled 2021-12-21 (×2): qty 2

## 2021-12-21 MED ORDER — ALBUTEROL SULFATE HFA 108 (90 BASE) MCG/ACT IN AERS
2.0000 | INHALATION_SPRAY | Freq: Four times a day (QID) | RESPIRATORY_TRACT | Status: DC | PRN
  Filled 2021-12-21: qty 6.7

## 2021-12-21 MED ORDER — MIDAZOLAM HCL 5 MG/5ML IJ SOLN
INTRAMUSCULAR | Status: DC | PRN
  Administered 2021-12-21: 2 mg via INTRAVENOUS
  Administered 2021-12-22: 4 mg via INTRAVENOUS

## 2021-12-21 MED ORDER — FENTANYL CITRATE (PF) 100 MCG/2ML IJ SOLN
25.0000 ug | INTRAMUSCULAR | Status: DC | PRN
  Administered 2021-12-21 (×3): 50 ug via INTRAVENOUS

## 2021-12-21 MED ORDER — LABETALOL HCL 5 MG/ML IV SOLN
INTRAVENOUS | Status: DC | PRN
  Administered 2021-12-21: 5 mg via INTRAVENOUS

## 2021-12-21 MED ORDER — THROMBIN 5000 UNITS EX SOLR
CUTANEOUS | Status: AC
  Filled 2021-12-21: qty 5000

## 2021-12-21 MED ORDER — SUCCINYLCHOLINE CHLORIDE 200 MG/10ML IV SOSY
PREFILLED_SYRINGE | INTRAVENOUS | Status: DC | PRN
  Administered 2021-12-21: 120 mg via INTRAVENOUS

## 2021-12-21 MED ORDER — EPHEDRINE SULFATE-NACL 50-0.9 MG/10ML-% IV SOSY
PREFILLED_SYRINGE | INTRAVENOUS | Status: DC | PRN
  Administered 2021-12-21 (×3): 5 mg via INTRAVENOUS

## 2021-12-21 MED ORDER — FENTANYL CITRATE (PF) 250 MCG/5ML IJ SOLN
INTRAMUSCULAR | Status: DC | PRN
  Administered 2021-12-21 (×2): 50 ug via INTRAVENOUS
  Administered 2021-12-21: 100 ug via INTRAVENOUS

## 2021-12-21 MED ORDER — SUCCINYLCHOLINE CHLORIDE 200 MG/10ML IV SOSY
PREFILLED_SYRINGE | INTRAVENOUS | Status: AC
  Filled 2021-12-21: qty 10

## 2021-12-21 MED ORDER — HEMOSTATIC AGENTS (NO CHARGE) OPTIME
TOPICAL | Status: DC | PRN
  Administered 2021-12-21: 1 via TOPICAL

## 2021-12-21 MED ORDER — PHENYLEPHRINE 40 MCG/ML (10ML) SYRINGE FOR IV PUSH (FOR BLOOD PRESSURE SUPPORT)
PREFILLED_SYRINGE | INTRAVENOUS | Status: AC
  Filled 2021-12-21: qty 10

## 2021-12-21 MED ORDER — LIDOCAINE HCL (CARDIAC) PF 100 MG/5ML IV SOSY
PREFILLED_SYRINGE | INTRAVENOUS | Status: DC | PRN
  Administered 2021-12-21: 40 mg via INTRAVENOUS

## 2021-12-21 MED ORDER — FENTANYL CITRATE (PF) 100 MCG/2ML IJ SOLN
INTRAMUSCULAR | Status: DC | PRN
  Administered 2021-12-21 (×2): 100 ug via INTRAVENOUS
  Administered 2021-12-22: 50 ug via INTRAVENOUS

## 2021-12-21 MED ORDER — CYCLOBENZAPRINE HCL 10 MG PO TABS
ORAL_TABLET | ORAL | Status: AC
  Filled 2021-12-21: qty 1

## 2021-12-21 MED ORDER — ROCURONIUM BROMIDE 100 MG/10ML IV SOLN
INTRAVENOUS | Status: DC | PRN
  Administered 2021-12-21: 60 mg via INTRAVENOUS
  Administered 2021-12-22: 20 mg via INTRAVENOUS

## 2021-12-21 MED ORDER — ONDANSETRON HCL 4 MG/2ML IJ SOLN
4.0000 mg | Freq: Four times a day (QID) | INTRAMUSCULAR | Status: DC | PRN
  Administered 2021-12-21 (×2): 4 mg via INTRAVENOUS
  Filled 2021-12-21: qty 2

## 2021-12-21 MED ORDER — MIDAZOLAM HCL 2 MG/2ML IJ SOLN
INTRAMUSCULAR | Status: AC
  Filled 2021-12-21: qty 2

## 2021-12-21 MED ORDER — HYDROCODONE-ACETAMINOPHEN 5-325 MG PO TABS
ORAL_TABLET | ORAL | Status: AC
  Filled 2021-12-21: qty 1

## 2021-12-21 MED ORDER — LACTATED RINGERS IV SOLN: INTRAVENOUS | Status: DC

## 2021-12-21 MED ORDER — SUCCINYLCHOLINE CHLORIDE 200 MG/10ML IV SOSY
PREFILLED_SYRINGE | INTRAVENOUS | Status: DC | PRN
  Administered 2021-12-21: 140 mg via INTRAVENOUS

## 2021-12-21 SURGICAL SUPPLY — 66 items
ADH SKN CLS APL DERMABOND .7 (GAUZE/BANDAGES/DRESSINGS) ×1
APL SKNCLS STERI-STRIP NONHPOA (GAUZE/BANDAGES/DRESSINGS) ×1
BAG COUNTER SPONGE SURGICOUNT (BAG) ×2 IMPLANT
BAG DECANTER FOR FLEXI CONT (MISCELLANEOUS) ×3 IMPLANT
BAG SPNG CNTER NS LX DISP (BAG) ×1
BAG SURGICOUNT SPONGE COUNTING (BAG) ×1
BAND INSRT 18 STRL LF DISP RB (MISCELLANEOUS) ×2
BAND RUBBER #18 3X1/16 STRL (MISCELLANEOUS) ×6 IMPLANT
BENZOIN TINCTURE PRP APPL 2/3 (GAUZE/BANDAGES/DRESSINGS) ×3 IMPLANT
BIT DRILL 13 (BIT) ×1 IMPLANT
BIT DRILL 13MM (BIT) ×1
BUR MATCHSTICK NEURO 3.0 LAGG (BURR) ×3 IMPLANT
CAGE PEEK 7X14X11 (Cage) ×6 IMPLANT
CANISTER SUCT 3000ML PPV (MISCELLANEOUS) ×3 IMPLANT
CARTRIDGE OIL MAESTRO DRILL (MISCELLANEOUS) ×1 IMPLANT
CLOSURE WOUND 1/2 X4 (GAUZE/BANDAGES/DRESSINGS) ×1
DERMABOND ADVANCED (GAUZE/BANDAGES/DRESSINGS) ×2
DERMABOND ADVANCED .7 DNX12 (GAUZE/BANDAGES/DRESSINGS) IMPLANT
DIFFUSER DRILL AIR PNEUMATIC (MISCELLANEOUS) ×3 IMPLANT
DRAPE C-ARM 42X72 X-RAY (DRAPES) ×6 IMPLANT
DRAPE LAPAROTOMY 100X72 PEDS (DRAPES) ×3 IMPLANT
DRAPE MICROSCOPE LEICA (MISCELLANEOUS) ×3 IMPLANT
DURAPREP 6ML APPLICATOR 50/CS (WOUND CARE) ×3 IMPLANT
ELECT COATED BLADE 2.86 ST (ELECTRODE) ×3 IMPLANT
ELECT REM PT RETURN 9FT ADLT (ELECTROSURGICAL) ×3
ELECTRODE REM PT RTRN 9FT ADLT (ELECTROSURGICAL) ×1 IMPLANT
GAUZE 4X4 16PLY ~~LOC~~+RFID DBL (SPONGE) ×2 IMPLANT
GAUZE SPONGE 4X4 12PLY STRL (GAUZE/BANDAGES/DRESSINGS) ×3 IMPLANT
GLOVE EXAM NITRILE XL STR (GLOVE) IMPLANT
GLOVE SURG LTX SZ9 (GLOVE) ×3 IMPLANT
GLOVE SURG UNDER POLY LF SZ7.5 (GLOVE) ×2 IMPLANT
GOWN STRL REUS W/ TWL LRG LVL3 (GOWN DISPOSABLE) IMPLANT
GOWN STRL REUS W/ TWL XL LVL3 (GOWN DISPOSABLE) ×1 IMPLANT
GOWN STRL REUS W/TWL 2XL LVL3 (GOWN DISPOSABLE) IMPLANT
GOWN STRL REUS W/TWL LRG LVL3 (GOWN DISPOSABLE) ×9
GOWN STRL REUS W/TWL XL LVL3 (GOWN DISPOSABLE) ×3
HALTER HD/CHIN CERV TRACTION D (MISCELLANEOUS) ×3 IMPLANT
HEMOSTAT POWDER KIT SURGIFOAM (HEMOSTASIS) ×2 IMPLANT
HEMOSTAT SURGICEL 2X14 (HEMOSTASIS) IMPLANT
KIT BASIN OR (CUSTOM PROCEDURE TRAY) ×3 IMPLANT
KIT TURNOVER KIT B (KITS) ×3 IMPLANT
NDL SPNL 20GX3.5 QUINCKE YW (NEEDLE) ×1 IMPLANT
NEEDLE SPNL 20GX3.5 QUINCKE YW (NEEDLE) ×3 IMPLANT
NS IRRIG 1000ML POUR BTL (IV SOLUTION) ×3 IMPLANT
OIL CARTRIDGE MAESTRO DRILL (MISCELLANEOUS) ×3
PACK LAMINECTOMY NEURO (CUSTOM PROCEDURE TRAY) ×3 IMPLANT
PAD ARMBOARD 7.5X6 YLW CONV (MISCELLANEOUS) ×9 IMPLANT
PLATE 45MM (Plate) ×3 IMPLANT
PLATE 45XATL VS ELT (Plate) IMPLANT
SCREW ST 13X4XST VA NS SPNE (Screw) IMPLANT
SCREW ST 15X4XST VA NS SPNE (Screw) IMPLANT
SCREW ST VAR 4 ATL (Screw) ×18 IMPLANT
SPACER SPNL 11X14X7XPEEK CVD (Cage) IMPLANT
SPCR SPNL 11X14X7XPEEK CVD (Cage) ×2 IMPLANT
SPONGE INTESTINAL PEANUT (DISPOSABLE) ×3 IMPLANT
SPONGE SURGIFOAM ABS GEL 100 (HEMOSTASIS) ×2 IMPLANT
SPONGE SURGIFOAM ABS GEL SZ50 (HEMOSTASIS) ×3 IMPLANT
SPONGE T-LAP 4X18 ~~LOC~~+RFID (SPONGE) ×2 IMPLANT
STRIP CLOSURE SKIN 1/2X4 (GAUZE/BANDAGES/DRESSINGS) ×2 IMPLANT
SUT VIC AB 3-0 SH 8-18 (SUTURE) ×3 IMPLANT
SUT VIC AB 4-0 RB1 18 (SUTURE) ×3 IMPLANT
TAPE CLOTH 4X10 WHT NS (GAUZE/BANDAGES/DRESSINGS) ×3 IMPLANT
TOWEL GREEN STERILE (TOWEL DISPOSABLE) ×3 IMPLANT
TOWEL GREEN STERILE FF (TOWEL DISPOSABLE) ×3 IMPLANT
TRAP SPECIMEN MUCUS 40CC (MISCELLANEOUS) ×3 IMPLANT
WATER STERILE IRR 1000ML POUR (IV SOLUTION) ×3 IMPLANT

## 2021-12-21 SURGICAL SUPPLY — 42 items
ADH SKN CLS APL DERMABOND .7 (GAUZE/BANDAGES/DRESSINGS) ×1
APL SKNCLS STERI-STRIP NONHPOA (GAUZE/BANDAGES/DRESSINGS)
BAG COUNTER SPONGE SURGICOUNT (BAG) ×2 IMPLANT
BAG SPNG CNTER NS LX DISP (BAG) ×1
BAND INSRT 18 STRL LF DISP RB (MISCELLANEOUS) ×2
BAND RUBBER #18 3X1/16 STRL (MISCELLANEOUS) ×4 IMPLANT
BENZOIN TINCTURE PRP APPL 2/3 (GAUZE/BANDAGES/DRESSINGS) IMPLANT
CANISTER SUCT 3000ML PPV (MISCELLANEOUS) ×2 IMPLANT
DERMABOND ADVANCED (GAUZE/BANDAGES/DRESSINGS) ×1
DERMABOND ADVANCED .7 DNX12 (GAUZE/BANDAGES/DRESSINGS) ×1 IMPLANT
DRAPE LAPAROTOMY 100X72 PEDS (DRAPES) ×2 IMPLANT
DRAPE MICROSCOPE LEICA (MISCELLANEOUS) ×2 IMPLANT
DRSG OPSITE 4X5.5 SM (GAUZE/BANDAGES/DRESSINGS) ×4 IMPLANT
DRSG OPSITE POSTOP 4X6 (GAUZE/BANDAGES/DRESSINGS) ×1 IMPLANT
DURAPREP 6ML APPLICATOR 50/CS (WOUND CARE) ×2 IMPLANT
ELECT COATED BLADE 2.86 ST (ELECTRODE) ×2 IMPLANT
ELECT REM PT RETURN 9FT ADLT (ELECTROSURGICAL) ×2
ELECTRODE REM PT RTRN 9FT ADLT (ELECTROSURGICAL) ×1 IMPLANT
EVACUATOR 1/8 PVC DRAIN (DRAIN) ×1 IMPLANT
GAUZE 4X4 16PLY ~~LOC~~+RFID DBL (SPONGE) ×1 IMPLANT
GLOVE SURG ENC MOIS LTX SZ7.5 (GLOVE) ×1 IMPLANT
GLOVE SURG LTX SZ7 (GLOVE) ×2 IMPLANT
GLOVE SURG UNDER POLY LF SZ7.5 (GLOVE) ×4 IMPLANT
GOWN STRL REUS W/ TWL LRG LVL3 (GOWN DISPOSABLE) ×2 IMPLANT
GOWN STRL REUS W/ TWL XL LVL3 (GOWN DISPOSABLE) IMPLANT
GOWN STRL REUS W/TWL 2XL LVL3 (GOWN DISPOSABLE) IMPLANT
GOWN STRL REUS W/TWL LRG LVL3 (GOWN DISPOSABLE) ×4
GOWN STRL REUS W/TWL XL LVL3 (GOWN DISPOSABLE) ×4
HEMOSTAT POWDER KIT SURGIFOAM (HEMOSTASIS) ×3 IMPLANT
KIT BASIN OR (CUSTOM PROCEDURE TRAY) ×2 IMPLANT
KIT TURNOVER KIT B (KITS) ×2 IMPLANT
NS IRRIG 1000ML POUR BTL (IV SOLUTION) ×2 IMPLANT
PACK LAMINECTOMY NEURO (CUSTOM PROCEDURE TRAY) ×2 IMPLANT
SPONGE INTESTINAL PEANUT (DISPOSABLE) ×2 IMPLANT
STAPLER VISISTAT 35W (STAPLE) ×2 IMPLANT
STRIP CLOSURE SKIN 1/2X4 (GAUZE/BANDAGES/DRESSINGS) IMPLANT
SUT VIC AB 3-0 SH 8-18 (SUTURE) ×2 IMPLANT
SUT VICRYL 3-0 RB1 18 ABS (SUTURE) ×3 IMPLANT
TAPE CLOTH 3X10 TAN LF (GAUZE/BANDAGES/DRESSINGS) ×2 IMPLANT
TOWEL GREEN STERILE (TOWEL DISPOSABLE) ×2 IMPLANT
TOWEL GREEN STERILE FF (TOWEL DISPOSABLE) ×2 IMPLANT
WATER STERILE IRR 1000ML POUR (IV SOLUTION) ×2 IMPLANT

## 2021-12-21 NOTE — Progress Notes (Signed)
Pt doesn't have drain in place post procedure. Will give vanc 1g IV x1 at 2000 tonight.   Onnie Boer, PharmD, BCIDP, AAHIVP, CPP Infectious Disease Pharmacist 12/21/2021 2:08 PM

## 2021-12-21 NOTE — Anesthesia Procedure Notes (Signed)
Procedure Name: Intubation Date/Time: 12/21/2021 8:17 AM Performed by: Thelma Comp, CRNA Pre-anesthesia Checklist: Patient identified, Emergency Drugs available, Suction available and Patient being monitored Patient Re-evaluated:Patient Re-evaluated prior to induction Oxygen Delivery Method: Circle System Utilized Preoxygenation: Pre-oxygenation with 100% oxygen Induction Type: IV induction Ventilation: Mask ventilation without difficulty Laryngoscope Size: Glidescope and 4 Grade View: Grade I Tube type: Oral Number of attempts: 1 Airway Equipment and Method: Stylet and Oral airway Placement Confirmation: ETT inserted through vocal cords under direct vision, positive ETCO2 and breath sounds checked- equal and bilateral Secured at: 23 cm Tube secured with: Tape Dental Injury: Teeth and Oropharynx as per pre-operative assessment  Comments: Elective glide

## 2021-12-21 NOTE — Anesthesia Preprocedure Evaluation (Addendum)
Anesthesia Evaluation  Patient identified by MRN, date of birth, ID band Patient awake    Reviewed: Allergy & Precautions, NPO status , Patient's Chart, lab work & pertinent test resultsPreop documentation limited or incomplete due to emergent nature of procedure.  Airway Mallampati: III  TM Distance: >3 FB Neck ROM: Limited   Comment: Large neck hematoma. No stridor. No obstruction. Dental  (+) Dental Advisory Given   Pulmonary asthma , Current Smoker and Patient abstained from smoking.,    breath sounds clear to auscultation       Cardiovascular hypertension,  Rhythm:Regular Rate:Normal     Neuro/Psych  Neuromuscular disease    GI/Hepatic   Endo/Other    Renal/GU      Musculoskeletal   Abdominal   Peds  Hematology   Anesthesia Other Findings   Reproductive/Obstetrics                             Anesthesia Physical Anesthesia Plan  ASA: 4 and emergent  Anesthesia Plan: General   Post-op Pain Management: Minimal or no pain anticipated   Induction: Intravenous and Rapid sequence  PONV Risk Score and Plan: 1 and Ondansetron, Dexamethasone and Treatment may vary due to age or medical condition  Airway Management Planned: Oral ETT and Video Laryngoscope Planned  Additional Equipment: None  Intra-op Plan:   Post-operative Plan: Possible Post-op intubation/ventilation and Extubation in OR  Informed Consent: I have reviewed the patients History and Physical, chart, labs and discussed the procedure including the risks, benefits and alternatives for the proposed anesthesia with the patient or authorized representative who has indicated his/her understanding and acceptance.     Dental advisory given  Plan Discussed with: CRNA  Anesthesia Plan Comments:        Anesthesia Quick Evaluation

## 2021-12-21 NOTE — Progress Notes (Signed)
Pt started c/o having discomfort to his right side neck at the incision site, Pt started to c/o not being able to breathe and talk, stated that he felt like his throat was closing up. RR was contacted and was at bedside. On call Neuro Surgeon was contacted, PT was taken back to OR. His Mother was called and updated.

## 2021-12-21 NOTE — Progress Notes (Signed)
Orthopedic Tech Progress Note Patient Details:  RAEQUON CATANZARO [birth year] [id number]  PACU RN called requesting a SOFT COLLAR for patient  Ortho Devices Type of Ortho Device: Soft collar Ortho Device/Splint Location: NECK Ortho Device/Splint Interventions: Ordered, Application   Post Interventions Patient Tolerated: Well Instructions Provided: Care of device  Donald Pore 12/21/2021, 11:23 AM

## 2021-12-21 NOTE — Brief Op Note (Signed)
12/21/2021  10:16 AM  PATIENT:  Ricky Cook  36 y.o. male  PRE-OPERATIVE DIAGNOSIS:  Stenosis  POST-OPERATIVE DIAGNOSIS:  Stenosis  PROCEDURE:  Procedure(s): Anterior Cervical Decompression Fusion  - Cervical four-Cervical five - Cervical five-Cervical six (N/A)  SURGEON:  Surgeon(s) and Role:    * Julio Sicks, MD - Primary    * Lisbeth Renshaw, MD - Assisting  PHYSICIAN ASSISTANT:   ASSISTANTSMarland Mcalpine   ANESTHESIA:   general  EBL:  100 mL   BLOOD ADMINISTERED:none  DRAINS: none   LOCAL MEDICATIONS USED:  None  SPECIMEN:  No Specimen  DISPOSITION OF SPECIMEN:  N/A  COUNTS:  YES  TOURNIQUET:  * No tourniquets in log *  DICTATION: .Dragon Dictation  PLAN OF CARE: Admit for overnight observation  PATIENT DISPOSITION:  PACU - hemodynamically stable.   Delay start of Pharmacological VTE agent (>24hrs) due to surgical blood loss or risk of bleeding: yes

## 2021-12-21 NOTE — Progress Notes (Signed)
°  NEUROSURGERY PROGRESS NOTE   Called to see patient emergently. Pt c/o progressively worsening difficulty breathing over this evening. Was unable to eat dinner. He c/o worsening neck pain and swelling  EXAM:  BP (!) 153/98 (BP Location: Right Arm)    Pulse 88    Temp 98.6 F (37 C) (Oral)    Resp 20    Ht 5' 11.5" (1.816 m)    Wt 121.6 kg    SpO2 98%    BMI 36.86 kg/m   Awake, alert, oriented  Speech fluent, appropriate  CN grossly intact  Moving all extremities well The right neck is tense and swollen Pt has stridorous respirations  IMPRESSION:  36 y.o. male s/p ACDF with c/o DIB and stridor with tense swollen neck obviously very concerning for hematoma with airway compression  PLAN: - Will take pt to OR emergently for airway management and neck exploration/evacuation of hematoma.  I reviewed the emergent nature of the procedure with the patient to secure his airway and evacuate hematoma. He provided verbal consent to proceed.  Lisbeth Renshaw, MD [facility]    Lisbeth Renshaw, MD [facility]

## 2021-12-21 NOTE — Transfer of Care (Signed)
Immediate Anesthesia Transfer of Care Note  Patient: Ricky Cook  Procedure(s) Performed: Anterior Cervical Decompression Fusion  - Cervical four-Cervical five - Cervical five-Cervical six  Patient Location: PACU  Anesthesia Type:General  Level of Consciousness: awake, alert  and patient cooperative  Airway & Oxygen Therapy: Patient Spontanous Breathing  Post-op Assessment: Report given to RN and Post -op Vital signs reviewed and stable  Post vital signs: Reviewed and stable  Last Vitals:  Vitals Value Taken Time  BP 174/99 12/21/21 1035  Temp    Pulse 74 12/21/21 1040  Resp 9 12/21/21 1040  SpO2 96 % 12/21/21 1040  Vitals shown include unvalidated device data.  Last Pain:  Vitals:   12/21/21 0715  TempSrc:   PainSc: 0-No pain      Patients Stated Pain Goal: 1 (12/21/21 0715)  Complications: No notable events documented.

## 2021-12-21 NOTE — Plan of Care (Signed)

## 2021-12-21 NOTE — H&P (Signed)
Ricky Cook is an 36 y.o. male.   Chief Complaint: Weakness HPI: 36 year old male with significant right upper extremity weakness numbness and paresthesias also with chronic and progressively worsening bilateral distal upper extremity weakness and incoordination with gait abnormality.  Work-up demonstrates evidence of a acute appearing right-sided C4-5 disc herniation with marked compression of his right C5 nerve root.  Patient also with severe stenosis at C4-5 and a large chronic appearing leftward disc herniation at C5-6 with severe cord compression.  Patient presents now for two-level anterior cervical decompression and fusion in hopes of improving his symptoms.  Past Medical History:  Diagnosis Date   Asthma    GERD (gastroesophageal reflux disease)    Hypertension     Past Surgical History:  Procedure Laterality Date   TONSILLECTOMY      Family History  Problem Relation Age of Onset   Hypertension Mother    Diabetes Mother    Hypercholesterolemia Father    Social History:  reports that he has been smoking cigarettes. He has been smoking an average of .25 packs per day. He has never used smokeless tobacco. He reports that he does not currently use drugs. He reports that he does not drink alcohol.  Allergies:  Allergies  Allergen Reactions   Ceftin [Cefuroxime Axetil] Shortness Of Breath   Augmentin [Amoxicillin-Pot Clavulanate] Other (See Comments)    thrush   Sulfa Antibiotics Other (See Comments)    Thrush    Medications Prior to Admission  Medication Sig Dispense Refill   albuterol (VENTOLIN HFA) 108 (90 Base) MCG/ACT inhaler Inhale 2 puffs into the lungs every 6 (six) hours as needed for wheezing or shortness of breath.     amLODipine (NORVASC) 5 MG tablet Take 10 mg by mouth daily.     cetirizine (ZYRTEC) 10 MG tablet Take 10 mg by mouth daily.     famotidine (PEPCID) 20 MG tablet Take 20 mg by mouth daily.     losartan (COZAAR) 100 MG tablet Take 100 mg by  mouth daily.     metroNIDAZOLE (FLAGYL) 500 MG tablet Take 1 tablet (500 mg total) by mouth 2 (two) times daily. (Patient not taking: Reported on 12/07/2021) 14 tablet 0   predniSONE (DELTASONE) 20 MG tablet Day 1-3: Take 3 tablets daily. Day 4-6: Take 2 tablets daily. Day 7-9: Take 1 tablet daily. Take tablets daily with breakfast. (Patient not taking: Reported on 12/07/2021) 18 tablet 0   tiZANidine (ZANAFLEX) 4 MG tablet Take 1 tablet (4 mg total) by mouth at bedtime. (Patient not taking: Reported on 12/08/2021) 30 tablet 0    Results for orders placed or performed during the hospital encounter of 12/21/21 (from the past 48 hour(s))  Type and screen [facility]     Status: None (Preliminary result)   Collection Time: 12/21/21  6:50 AM  Result Value Ref Range   ABO/RH(D) PENDING    Antibody Screen PENDING    Sample Expiration      12/24/2021,2359 Performed at [facility], [address] [place], [place] [address]    No results found.  Pertinent items noted in HPI and remainder of comprehensive ROS otherwise negative.  Blood pressure (!) 172/99, pulse 69, temperature 98.1 F (36.7 C), temperature source Oral, resp. rate 17, height 5' 11.5" (1.816 m), weight 121.6 kg, SpO2 100 %.  Patient is awake and alert.  He is oriented and appropriate.  Speech is fluent.  Judgment insight are intact.  Cranial nerve function  normal bilateral.  Motor examination reveals 3/5 weakness of his right deltoid muscle and biceps muscle.  Patient with moderate grip strength weakness and intrinsic weakness bilaterally.  Sensor examination with patchy distal sensory loss in both upper extremities.  Reflexes are hyperactive.  Hoffmann's responses present in both hands left greater than right.  Gait is unsteady and spastic.  Examination head ears eyes nose and throat is unremarked.  Chest and abdomen are benign.  Extremities are free from injury or deformity. 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  Patient also with significant cervical stenosis with cervical myelopathy.  Plan C4-5, C5-6 anterior cervical discectomy with interbody fusion utilizing interbody cages, local harvested autograft, and anterior plate fixation.  Risks and benefits been explained.  Patient wishes to proceed.  Sherilyn Cooter A Keyana Guevara 12/21/2021, 7:50 AM

## 2021-12-21 NOTE — Op Note (Signed)
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  Asst. Surgeon: Conchita Paris, MD; Doran Durand, NP  Anesthesia: General  Indication: 36 year old male with significant neck and right upper extremity pain paresthesias and weakness consistent with a right-sided C5 radiculopathy as well as significant difficulty with bilateral upper extremity numbness and fine motor weakness with gait instability consistent with cervical myelopathy.  Work-up demonstrates evidence of a rightward C4-5 disc herniation and large leftward C5-6 disc herniation with spinal cord compression.  Patient presents now for two-level anterior cervical discectomy and fusion in hopes of improving his symptoms.  Patient was noted to be COVID-positive on preoperative screening.  He is remained afebrile.  He has no significant respiratory symptoms.  He wishes to move forward with surgery.  Operative note: After induction of anesthesia, patient positioned supine with neck slightly extended and held placed halter traction.  Patient's anterior cervical region prepped and draped sterilely.  Incision made overlying C5.  Dissection performed on the right.  Retractor placed.  Fluoroscopy used.  Levels confirmed.  The spaces at C4-5 and C5-6 incised using 15 blade.  Discectomy then performed using various instruments down to the level of the posterior logical ligament.  Posterior Longitudinal ligament is then elevated and resected piecemeal fashion.  Underlying thecal sac identified.  Wide central decompression then performed by undercutting the bodies of C4 and  C5.  Decompression then proceeded each neural foramina.  Wide anterior foraminotomies were performed on the course exiting C5 nerve root bilaterally including removal of a significant foraminal disc herniation on the right at C4-5.  At this point a very thorough decompression been achieved.  There was no evidence of injury to thecal sac or nerve roots.  Wound is then irrigated with antibiotic solution.  Gelfoam was placed topically for hemostasis.  Procedure then repeated at C5-6 again without complications and with the findings of a significant leftward disc herniation tracking behind the body of C6 on the left side.  Gelfoam was removed.  7 mm [employer] anatomic peek cages were then packed into place at both levels.  Each cage was recessed slightly from the anterior cortical margin.  A 45 mm Atlantis anterior cervical plate was then placed over the C4, C5 and C6 levels.  This then attached under fluoroscopic guidance using 15 mm variable angle screws to each at all 3 levels.  All screws given final tightening.  Locking screws were engaged.  Final images reveal good position of the cages and the hardware at the proper operative level with normal alignment of the spine.  Wound is then inspected for hemostasis which found to be good.  Was irrigated 1 final time and then closed in a typical fashion.  Steri-Strips and sterile dressing were applied.  No apparent complications.  Patient tolerated the procedure well and he returns to the recovery room

## 2021-12-22 ENCOUNTER — Inpatient Hospital Stay (HOSPITAL_COMMUNITY): Payer: Medicaid Other

## 2021-12-22 ENCOUNTER — Encounter (HOSPITAL_COMMUNITY): Payer: Self-pay | Admitting: Neurosurgery

## 2021-12-22 DIAGNOSIS — S1093XA Contusion of unspecified part of neck, initial encounter: Secondary | ICD-10-CM

## 2021-12-22 DIAGNOSIS — M50122 Cervical disc disorder at C5-C6 level with radiculopathy: Secondary | ICD-10-CM | POA: Diagnosis not present

## 2021-12-22 DIAGNOSIS — Z881 Allergy status to other antibiotic agents status: Secondary | ICD-10-CM | POA: Diagnosis not present

## 2021-12-22 DIAGNOSIS — S1093XD Contusion of unspecified part of neck, subsequent encounter: Secondary | ICD-10-CM | POA: Diagnosis not present

## 2021-12-22 DIAGNOSIS — R061 Stridor: Secondary | ICD-10-CM | POA: Diagnosis not present

## 2021-12-22 DIAGNOSIS — F17210 Nicotine dependence, cigarettes, uncomplicated: Secondary | ICD-10-CM | POA: Diagnosis not present

## 2021-12-22 DIAGNOSIS — M4722 Other spondylosis with radiculopathy, cervical region: Secondary | ICD-10-CM

## 2021-12-22 DIAGNOSIS — U071 COVID-19: Secondary | ICD-10-CM | POA: Diagnosis not present

## 2021-12-22 DIAGNOSIS — M50021 Cervical disc disorder at C4-C5 level with myelopathy: Secondary | ICD-10-CM | POA: Diagnosis not present

## 2021-12-22 DIAGNOSIS — J9690 Respiratory failure, unspecified, unspecified whether with hypoxia or hypercapnia: Secondary | ICD-10-CM

## 2021-12-22 DIAGNOSIS — M4712 Other spondylosis with myelopathy, cervical region: Principal | ICD-10-CM

## 2021-12-22 DIAGNOSIS — L7632 Postprocedural hematoma of skin and subcutaneous tissue following other procedure: Secondary | ICD-10-CM | POA: Diagnosis not present

## 2021-12-22 DIAGNOSIS — I10 Essential (primary) hypertension: Secondary | ICD-10-CM | POA: Diagnosis not present

## 2021-12-22 DIAGNOSIS — K219 Gastro-esophageal reflux disease without esophagitis: Secondary | ICD-10-CM | POA: Diagnosis present

## 2021-12-22 DIAGNOSIS — Z79899 Other long term (current) drug therapy: Secondary | ICD-10-CM | POA: Diagnosis not present

## 2021-12-22 DIAGNOSIS — Z880 Allergy status to penicillin: Secondary | ICD-10-CM | POA: Diagnosis not present

## 2021-12-22 DIAGNOSIS — J45909 Unspecified asthma, uncomplicated: Secondary | ICD-10-CM | POA: Diagnosis not present

## 2021-12-22 DIAGNOSIS — M4802 Spinal stenosis, cervical region: Secondary | ICD-10-CM | POA: Diagnosis not present

## 2021-12-22 DIAGNOSIS — Z882 Allergy status to sulfonamides status: Secondary | ICD-10-CM | POA: Diagnosis not present

## 2021-12-22 DIAGNOSIS — Z8249 Family history of ischemic heart disease and other diseases of the circulatory system: Secondary | ICD-10-CM | POA: Diagnosis not present

## 2021-12-22 DIAGNOSIS — Y838 Other surgical procedures as the cause of abnormal reaction of the patient, or of later complication, without mention of misadventure at the time of the procedure: Secondary | ICD-10-CM | POA: Diagnosis not present

## 2021-12-22 DIAGNOSIS — M50121 Cervical disc disorder at C4-C5 level with radiculopathy: Secondary | ICD-10-CM | POA: Diagnosis not present

## 2021-12-22 DIAGNOSIS — R531 Weakness: Secondary | ICD-10-CM | POA: Diagnosis present

## 2021-12-22 DIAGNOSIS — M50022 Cervical disc disorder at C5-C6 level with myelopathy: Secondary | ICD-10-CM | POA: Diagnosis not present

## 2021-12-22 DIAGNOSIS — Z419 Encounter for procedure for purposes other than remedying health state, unspecified: Secondary | ICD-10-CM

## 2021-12-22 LAB — POCT I-STAT 7, (LYTES, BLD GAS, ICA,H+H)
Acid-Base Excess: 3 mmol/L — ABNORMAL HIGH (ref 0.0–2.0)
Bicarbonate: 26.5 mmol/L (ref 20.0–28.0)
Calcium, Ion: 1.18 mmol/L (ref 1.15–1.40)
HCT: 39 % (ref 39.0–52.0)
Hemoglobin: 13.3 g/dL (ref 13.0–17.0)
O2 Saturation: 100 %
Patient temperature: 98.6
Potassium: 4.2 mmol/L (ref 3.5–5.1)
Sodium: 137 mmol/L (ref 135–145)
TCO2: 28 mmol/L (ref 22–32)
pCO2 arterial: 37.4 mmHg (ref 32.0–48.0)
pH, Arterial: 7.457 — ABNORMAL HIGH (ref 7.350–7.450)
pO2, Arterial: 295 mmHg — ABNORMAL HIGH (ref 83.0–108.0)

## 2021-12-22 LAB — BASIC METABOLIC PANEL
Anion gap: 8 (ref 5–15)
BUN: 12 mg/dL (ref 6–20)
CO2: 25 mmol/L (ref 22–32)
Calcium: 9.2 mg/dL (ref 8.9–10.3)
Chloride: 103 mmol/L (ref 98–111)
Creatinine, Ser: 1.34 mg/dL — ABNORMAL HIGH (ref 0.61–1.24)
GFR, Estimated: >60 mL/min (ref >60)
Glucose, Bld: 160 mg/dL — ABNORMAL HIGH (ref 70–99)
Potassium: 4.2 mmol/L (ref 3.5–5.1)
Sodium: 136 mmol/L (ref 135–145)

## 2021-12-22 LAB — CBC
HCT: 40.6 % (ref 39.0–52.0)
Hemoglobin: 13.3 g/dL (ref 13.0–17.0)
MCH: 28.7 pg (ref 26.0–34.0)
MCHC: 32.8 g/dL (ref 30.0–36.0)
MCV: 87.5 fL (ref 80.0–100.0)
Platelets: 257 10*3/uL (ref 150–400)
RBC: 4.64 MIL/uL (ref 4.22–5.81)
RDW: 14 % (ref 11.5–15.5)
WBC: 12 10*3/uL — ABNORMAL HIGH (ref 4.0–10.5)
nRBC: 0 % (ref 0.0–0.2)

## 2021-12-22 LAB — MAGNESIUM: Magnesium: 1.9 mg/dL (ref 1.7–2.4)

## 2021-12-22 LAB — PHOSPHORUS: Phosphorus: 2.9 mg/dL (ref 2.5–4.6)

## 2021-12-22 MED ORDER — PHENYLEPHRINE 40 MCG/ML (10ML) SYRINGE FOR IV PUSH (FOR BLOOD PRESSURE SUPPORT)
PREFILLED_SYRINGE | INTRAVENOUS | Status: AC
  Filled 2021-12-22: qty 10

## 2021-12-22 MED ORDER — PHENYLEPHRINE HCL (PRESSORS) 10 MG/ML IV SOLN
INTRAVENOUS | Status: DC | PRN
Start: 2021-12-22 — End: 2021-12-22
  Administered 2021-12-22 (×3): 80 ug via INTRAVENOUS

## 2021-12-22 MED ORDER — LABETALOL HCL 5 MG/ML IV SOLN: 5.0000 mg | INTRAVENOUS | Status: DC | PRN

## 2021-12-22 MED ORDER — 0.9 % SODIUM CHLORIDE (POUR BTL) OPTIME
TOPICAL | Status: DC | PRN
  Administered 2021-12-22: 1000 mL

## 2021-12-22 MED ORDER — LIDOCAINE 2% (20 MG/ML) 5 ML SYRINGE
INTRAMUSCULAR | Status: AC
  Filled 2021-12-22: qty 5

## 2021-12-22 MED ORDER — MIDAZOLAM HCL 2 MG/2ML IJ SOLN
INTRAMUSCULAR | Status: AC
  Filled 2021-12-22: qty 2

## 2021-12-22 MED ORDER — DEXAMETHASONE SODIUM PHOSPHATE 10 MG/ML IJ SOLN
INTRAMUSCULAR | Status: AC
  Filled 2021-12-22: qty 1

## 2021-12-22 MED ORDER — PANTOPRAZOLE SODIUM 40 MG IV SOLR: 40.0000 mg | Freq: Every day | INTRAVENOUS | Status: DC

## 2021-12-22 MED ORDER — THROMBIN (RECOMBINANT) 5000 UNITS EX SOLR
CUTANEOUS | Status: AC
  Filled 2021-12-22: qty 5000

## 2021-12-22 MED ORDER — PROPOFOL 1000 MG/100ML IV EMUL
5.0000 ug/kg/min | INTRAVENOUS | Status: DC
  Administered 2021-12-22 (×2): 50 ug/kg/min via INTRAVENOUS
  Filled 2021-12-22: qty 100

## 2021-12-22 MED ORDER — SUCCINYLCHOLINE CHLORIDE 200 MG/10ML IV SOSY
PREFILLED_SYRINGE | INTRAVENOUS | Status: AC
  Filled 2021-12-22: qty 10

## 2021-12-22 MED ORDER — ROCURONIUM BROMIDE 10 MG/ML (PF) SYRINGE
PREFILLED_SYRINGE | INTRAVENOUS | Status: AC
  Filled 2021-12-22: qty 10

## 2021-12-22 MED ORDER — FENTANYL CITRATE PF 50 MCG/ML IJ SOSY
25.0000 ug | PREFILLED_SYRINGE | INTRAMUSCULAR | Status: DC | PRN
  Administered 2021-12-22 (×3): 100 ug via INTRAVENOUS
  Filled 2021-12-22 (×2): qty 2

## 2021-12-22 MED ORDER — HYDRALAZINE HCL 20 MG/ML IJ SOLN
10.0000 mg | INTRAMUSCULAR | Status: DC | PRN
  Administered 2021-12-22: 40 mg via INTRAVENOUS
  Filled 2021-12-22: qty 2

## 2021-12-22 MED ORDER — CEFAZOLIN SODIUM 1 G IJ SOLR
INTRAMUSCULAR | Status: AC
  Filled 2021-12-22: qty 20

## 2021-12-22 MED ORDER — PROPOFOL 1000 MG/100ML IV EMUL
INTRAVENOUS | Status: AC
  Filled 2021-12-22: qty 100

## 2021-12-22 MED ORDER — THROMBIN 5000 UNITS EX SOLR: OROMUCOSAL | Status: DC | PRN

## 2021-12-22 MED ORDER — ONDANSETRON HCL 4 MG/2ML IJ SOLN
INTRAMUSCULAR | Status: AC
  Filled 2021-12-22: qty 2

## 2021-12-22 MED ORDER — LOSARTAN POTASSIUM 50 MG PO TABS
100.0000 mg | ORAL_TABLET | Freq: Every day | ORAL | Status: DC
  Administered 2021-12-23: 100 mg
  Filled 2021-12-22 (×2): qty 2

## 2021-12-22 MED ORDER — CHLORHEXIDINE GLUCONATE 0.12% ORAL RINSE (MEDLINE KIT)
15.0000 mL | Freq: Two times a day (BID) | OROMUCOSAL | Status: DC
  Administered 2021-12-22 (×2): 15 mL via OROMUCOSAL

## 2021-12-22 MED ORDER — CHLORHEXIDINE GLUCONATE CLOTH 2 % EX PADS
6.0000 | MEDICATED_PAD | Freq: Every day | CUTANEOUS | Status: DC
  Administered 2021-12-22: 6 via TOPICAL

## 2021-12-22 MED ORDER — AMLODIPINE BESYLATE 10 MG PO TABS
10.0000 mg | ORAL_TABLET | Freq: Every day | ORAL | Status: DC
  Administered 2021-12-23: 10 mg
  Filled 2021-12-22 (×2): qty 1

## 2021-12-22 NOTE — Anesthesia Postprocedure Evaluation (Signed)
Anesthesia Post Note  Patient: MANFRED LASPINA  Procedure(s) Performed: Anterior Cervical Decompression Fusion  - Cervical four-Cervical five - Cervical five-Cervical six     Patient location during evaluation: PACU Anesthesia Type: General Level of consciousness: awake and alert Pain management: pain level controlled Vital Signs Assessment: post-procedure vital signs reviewed and stable Respiratory status: spontaneous breathing, nonlabored ventilation and respiratory function stable Cardiovascular status: blood pressure returned to baseline and stable Postop Assessment: no apparent nausea or vomiting Anesthetic complications: no   No notable events documented.  Last Vitals:  Vitals:   12/22/21 1500 12/22/21 1600  BP: 138/75 (!) 162/93  Pulse: 73 (!) 106  Resp: 12 (!) 21  Temp:    SpO2: 100% 97%    Last Pain:  Vitals:   12/22/21 1622  TempSrc:   PainSc: 9    Pain Goal: Patients Stated Pain Goal: 3 (12/21/21 1035)  LLE Motor Response: Purposeful movement (12/22/21 1600)   RLE Motor Response: Purposeful movement (12/22/21 1600)          Cecile Hearing

## 2021-12-22 NOTE — Evaluation (Signed)
Occupational Therapy Evaluation Patient Details Name: Ricky Cook MRN: [id number] DOB: 08/25/1986 Today's Date: 12/22/2021   History of Present Illness 36 y.o. 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    He underwent surgery C4-5, C5-6 ACDF on 12/21/21.  Later that night, he had dyspnea and dysphagia.  He was taken back to OR emergently where he had exploration of neck wound and evacuation of hematoma.   Post surgery, he remained on the vent, and was extubated 1/4.  Of note, he had incidental finding of COVID positive on 12/30.   Clinical Impression   Patient admitted for the above procedure.  Patient with transfer to 4N s/p evacuation of hematoma and vented.  Post extubation he is fearful of moving and has a very rigid posture.   With encouragement and practice, he is moving much better and should recover well at home.  Mobility limited by IV being kept outside his door with long line.  OT will continue efforts in the acute setting, but home with occasional assist is recommended when cleared medically.  Patient lives at home with his parents.       Recommendations for follow up therapy are one component of a multi-disciplinary discharge planning process, led by the attending physician.  Recommendations may be updated based on patient status, additional functional criteria and insurance authorization.   Follow Up Recommendations  No OT follow up    Assistance Recommended at Discharge Intermittent Supervision/Assistance  Patient can return home with the following A little help with bathing/dressing/bathroom    Functional Status Assessment  Patient has had a recent decline in their functional status and demonstrates the ability to make significant  improvements in function in a reasonable and predictable amount of time.  Equipment Recommendations  Tub/shower seat;BSC/3in1    Recommendations for Other Services       Precautions / Restrictions Precautions Precautions: Cervical Precaution Booklet Issued: No Required Braces or Orthoses: Cervical Brace Cervical Brace: Soft collar;For comfort Restrictions Weight Bearing Restrictions: No Other Position/Activity Restrictions: C collar was lost during his transition from 5N      Mobility Bed Mobility Overal bed mobility: Needs Assistance Bed Mobility: Supine to Sit     Supine to sit: Supervision;HOB elevated          Transfers Overall transfer level: Needs assistance Equipment used: Rolling walker (2 wheels) Transfers: Sit to/from Stand;Bed to chair/wheelchair/BSC Sit to Stand: Min guard     Step pivot transfers: Min guard            Balance Overall balance assessment: Needs assistance Sitting-balance support: Feet supported;No upper extremity supported Sitting balance-Leahy Scale: Fair Sitting balance - Comments: very rigid posture - fearful of hurting something   Standing balance support: Bilateral upper extremity supported;Reliant on assistive device for balance Standing balance-Leahy Scale: Fair                             ADL either performed or assessed with clinical judgement   ADL                   Upper Body Dressing : Minimal assistance;Sitting   Lower Body Dressing: Minimal assistance;Sit to/from stand   Toilet Transfer: Min guard;Rolling  walker (2 wheels);BSC/3in1;Stand-pivot                   Vision Patient Visual Report: No change from baseline       Perception Perception Perception: Not tested   Praxis Praxis Praxis: Not tested    Pertinent Vitals/Pain Pain Assessment: Faces Faces Pain Scale: Hurts a little bit Pain Location: neck Pain Descriptors / Indicators: Tender;Tightness Pain Intervention(s):  Monitored during session     Hand Dominance Right   Extremity/Trunk Assessment Upper Extremity Assessment Upper Extremity Assessment: Overall WFL for tasks assessed   Lower Extremity Assessment Lower Extremity Assessment: Defer to PT evaluation   Cervical / Trunk Assessment Cervical / Trunk Assessment: Neck Surgery   Communication Communication Communication: No difficulties   Cognition Arousal/Alertness: Awake/alert Behavior During Therapy: Anxious Overall Cognitive Status: Within Functional Limits for tasks assessed                                                        Home Living Family/patient expects to be discharged to:: Private residence Living Arrangements: Parent Available Help at Discharge: Family;Available 24 hours/day Type of Home: House Home Access: Stairs to enter [employer] of Steps: 4 Entrance Stairs-Rails: Right;Left Home Layout: One level     Bathroom Shower/Tub: Chief Strategy Officer: Standard Bathroom Accessibility: Yes How Accessible: Accessible via walker Home Equipment: None          Prior Functioning/Environment Prior Level of Function : Independent/Modified Independent;Working/employed;Driving                        OT Problem List: Decreased knowledge of use of DME or AE;Decreased knowledge of precautions;Pain      OT Treatment/Interventions: Self-care/ADL training;Therapeutic activities;Balance training;DME and/or AE instruction    OT Goals(Current goals can be found in the care plan section) Acute Rehab OT Goals Patient Stated Goal: Go back to work Time For Goal Achievement: 01/05/22 Potential to Achieve Goals: Good ADL Goals Pt Will Perform Grooming: with modified independence;standing Pt Will Perform Upper Body Dressing: with modified independence;sitting Pt Will Perform Lower Body Dressing: with modified independence;sit to/from stand Pt Will Transfer to Toilet: with  modified independence;regular height toilet;ambulating  OT Frequency: Min 2X/week    Co-evaluation              AM-PAC OT "6 Clicks" Daily Activity     Outcome Measure Help from another person eating meals?: Total Help from another person taking care of personal grooming?: A Little Help from another person toileting, which includes using toliet, bedpan, or urinal?: A Little Help from another person bathing (including washing, rinsing, drying)?: A Little Help from another person to put on and taking off regular upper body clothing?: A Little Help from another person to put on and taking off regular lower body clothing?: A Little 6 Click Score: 16   End of Session Equipment Utilized During Treatment: Rolling walker (2 wheels) Nurse Communication: Mobility status  Activity Tolerance: Patient tolerated treatment well Patient left: in chair;with call bell/phone within reach;with family/visitor present  OT Visit Diagnosis: Unsteadiness on feet (R26.81);Pain                Time: 9147-8295 OT Time Calculation (min): 30 min Charges:  OT General Charges $OT Visit: 1 Visit OT Evaluation $  OT Eval Moderate Complexity: 1 Mod OT Treatments $Therapeutic Activity: 8-22 mins  12/22/2021  RP, OTR/L  Acute Rehabilitation Services  Office:  [phone]   Suzanna Obey 12/22/2021, 3:53 PM

## 2021-12-22 NOTE — Op Note (Signed)
NEUROSURGERY OPERATIVE NOTE   PREOP DIAGNOSIS:  Neck hematoma   POSTOP DIAGNOSIS: Same  PROCEDURE: Exploration of neck wound, evacuation of hematoma  SURGEON: Dr. Lisbeth Renshaw, MD  ASSISTANT: None  ANESTHESIA: General Endotracheal  EBL: 50cc  SPECIMENS: None  DRAINS: Medium Hemovac  COMPLICATIONS: None immediate  CONDITION: Hemodynamically stable, intubated to neuro ICU  HISTORY: Ricky Cook is a 36 y.o. male who earlier today underwent C4-5 C5-6 anterior cervical discectomy and fusion initially presenting with right C5 radiculopathy as well as symptoms of myelopathy related to large left eccentric disc herniation at C5-6.  Patient was doing reasonably well immediately postoperatively however began to experience progressively worsening difficulty breathing and exam demonstrating stridor with a large tense swollen neck suggesting large underlying hematoma.  Patient was therefore emergently taken to the operating room for airway management and neck exploration.  PROCEDURE IN DETAIL: The patient was brought to the operating room emergently.  The anesthesia service was able to smoothly place and endotracheal tube and induced general anesthesia.  Patient was then positioned appropriately in the supine position with the right neck exposed with all pressure points meticulously padded.  After timeout was conducted, the right transverse neck incision was opened sharply.  There is a small amount of subcutaneous hematoma.  The platysma was identified and stitches and the platysma was cut.  A large amount of both solid and small amount of liquid hematoma was immediately encountered upon opening the platysma muscle.  Blunt dissection with a finger was used to dissect down to the prevertebral space and identify the anterior cervical plate.  There was a large amount of hematoma which appeared to be tracking underneath the trachea and displacing it both anteriorly and towards the left.   Using a combination of suction and blunt dissection, all this hematoma was removed until I was able to identify the trachea and the underlying longus coli muscles, as well as the carotid sheath laterally.    There did appear to be some oozing from the longus coli muscles as well as more superficially and laterally around the region of the carotid sheath.  No arterial bleeding was identified.  The neck wound was covered essentially with a layer of morselized Gelfoam with thrombin and multiple 4 x 4 sponges were packed into the wound.  After a few minutes, the sponges were removed and the wound was irrigated.  There is only a small amount of ooze noted from the lateral neck.  The same process was repeated with morselized Gelfoam and 4 x 4 sponges.  Once these were removed, the wound was irrigated with a copious amount of normal saline irrigation without any active bleeding identified.  Irrigation was clear.  I then placed a medium Hemovac drain and tunneled subcutaneously.  I did place a thin layer of morselized Gelfoam with thrombin along the exposed neck tissue.  The platysma was then closed with interrupted 3-0 Vicryl and the skin was closed with interrupted subcuticular 3-0 Vicryl.  The drain was secured in place with Vicryl stitch.  A layer of Dermabond was applied.  After this dried sterile dressing was placed.  At the end of the case all sponge, needle, instrument, and cottonoid counts were correct.  Because the patient did have airway compromise and his neck seemed somewhat oozy, I elected to leave the patient intubated in order to preserve airway control with plan to monitor for recurrent neck hematoma and extubate likely later in the day if his neck remained stable.  Lisbeth Renshaw, MD [facility]

## 2021-12-22 NOTE — Anesthesia Procedure Notes (Signed)
Procedure Name: Intubation Date/Time: 12/21/2021 11:31 PM Performed by: Forever Arechiga T, CRNA Pre-anesthesia Checklist: Patient identified, Emergency Drugs available, Suction available and Patient being monitored Patient Re-evaluated:Patient Re-evaluated prior to induction Oxygen Delivery Method: Circle system utilized Preoxygenation: Pre-oxygenation with 100% oxygen Induction Type: IV induction and Rapid sequence Ventilation: Mask ventilation without difficulty Laryngoscope Size: Glidescope and 3 Grade View: Grade I Tube type: Oral Tube size: 7.0 mm Number of attempts: 1 Airway Equipment and Method: Stylet and Video-laryngoscopy Placement Confirmation: ETT inserted through vocal cords under direct vision, positive ETCO2 and breath sounds checked- equal and bilateral Secured at: 23 cm Tube secured with: Tape Dental Injury: Teeth and Oropharynx as per pre-operative assessment

## 2021-12-22 NOTE — Transfer of Care (Signed)
Immediate Anesthesia Transfer of Care Note  Patient: Ricky Cook  Procedure(s) Performed: ANTERIOR CERVICAL WOUND EXPLORATION WITH EVACUATION OF HEMATOMA (Spine Cervical)  Patient Location: PACU  Anesthesia Type:General  Level of Consciousness: Patient remains intubated per anesthesia plan  Airway & Oxygen Therapy: Patient remains intubated per anesthesia plan and Patient placed on Ventilator (see vital sign flow sheet for setting)  Post-op Assessment: Report given to RN, Post -op Vital signs reviewed and stable and Patient moving all extremities  Post vital signs: Reviewed and stable  Last Vitals:  Vitals Value Taken Time  BP 137/76 12/22/21 0130  Temp    Pulse 76 12/22/21 0141  Resp 18 12/22/21 0141  SpO2 100 % 12/22/21 0141  Vitals shown include unvalidated device data.  Last Pain:  Vitals:   12/21/21 2132  TempSrc: Oral  PainSc:       Patients Stated Pain Goal: 3 (12/21/21 1035)  Complications: No notable events documented.

## 2021-12-22 NOTE — Progress Notes (Signed)
Y[facility] swallow screen attempted. Pt had pauses during screening and stated he felt like the water was going dow the wrong way. Pt did not cough or choke. Will remain NPO and retry Yale swallow again later.

## 2021-12-22 NOTE — Anesthesia Postprocedure Evaluation (Signed)
Anesthesia Post Note  Patient: Ricky Cook  Procedure(s) Performed: ANTERIOR CERVICAL WOUND EXPLORATION WITH EVACUATION OF HEMATOMA (Spine Cervical)     Patient location during evaluation: SICU Anesthesia Type: General Level of consciousness: sedated Pain management: pain level controlled Vital Signs Assessment: post-procedure vital signs reviewed and stable Respiratory status: patient remains intubated per anesthesia plan Cardiovascular status: stable Postop Assessment: no apparent nausea or vomiting Anesthetic complications: no   No notable events documented.  Last Vitals:  Vitals:   12/22/21 0230 12/22/21 0300  BP: 127/80 (!) 145/92  Pulse: 75 69  Resp: 18 18  Temp:    SpO2: 100% 100%    Last Pain:  Vitals:   12/21/21 2132  TempSrc: Oral  PainSc:                  Kennieth Rad

## 2021-12-22 NOTE — Progress Notes (Signed)
OT Cancellation Note  Patient Details Name: Ricky Cook MRN: [id number] DOB: February 09, 1986   Cancelled Treatment:    Reason Eval/Treat Not Completed: Patient not medically ready Currently weaning. If extubated and stable, will attempt to see this pm as schedule allows.  Thornell Mule, OT/L   Acute OT Clinical Specialist Acute Rehabilitation Services Pager [phone] Office [phone]  12/22/2021, 8:06 AM

## 2021-12-22 NOTE — Procedures (Signed)
Extubation Procedure Note  Patient Details:   Name: Ricky Cook DOB: [birth year] MRN: [id number]   Airway Documentation:    Vent end date: 12/22/21 Vent end time: 0848   Evaluation  O2 sats: stable throughout Complications: No apparent complications Patient did tolerate procedure well. Bilateral Breath Sounds: Clear, Diminished   Yes  Patient extubated per order to 4L [place] with no apparent complications. Positive cuff leak was noted prior to extubation. Patient is oriented to place, has a strong cough,and is able to speak. Vitals are stable. RT will continue to monitor.   Ricky Cook 12/22/2021, 8:53 AM

## 2021-12-22 NOTE — Progress Notes (Signed)
eLink Physician-Brief Progress Note Patient Name: Ricky Cook DOB: [birth year] MRN: [id number]   Date of Service  12/22/2021  HPI/Events of Note  Patient had to go back to the OR to evacuate a post-op hematoma following cervical disc surgery, he needs orders for post-op sedation and analgesia.  eICU Interventions  Propofol gtt + PRN iv Fentanyl boluses ordered.        [name] 12/22/2021, 3:21 AM

## 2021-12-22 NOTE — Progress Notes (Signed)
° °  Providing Compassionate, Quality Care - Together   Subjective: Patient reports difficulty swallowing. He denies shortness of breath.  Objective: Vital signs in last 24 hours: Temp:  [98.5 F (36.9 C)-99.6 F (37.6 C)] 99.2 F (37.3 C) (01/04 1200) Pulse Rate:  [69-118] 72 (01/04 1400) Resp:  [14-23] 20 (01/04 1400) BP: (116-183)/(52-113) 163/83 (01/04 1400) SpO2:  [97 %-100 %] 98 % (01/04 1400) FiO2 (%):  [36 %-70 %] 36 % (01/04 0850)  Intake/Output from previous day: 01/03 0701 - 01/04 0700 In: 3220 [P.O.:720; I.V.:2500] Out: 3150 [Urine:2950; Blood:200] Intake/Output this shift: Total I/O In: 198.3 [I.V.:198.3] Out: 350 [Urine:350]  Responds to voice, oriented x 4 PERRLA CN II-XII grossly intact MAE, Strength and sensation intact Neck with bruising and swelling Hemovac drain in place. 25 mL out since placement Incision is covered with Honeycomb dressing and Steri Strips; Dressing is clean, dry, and intact   Lab Results: Recent Labs    12/22/21 0359 12/22/21 0419  WBC  --  12.0*  HGB 13.3 13.3  HCT 39.0 40.6  PLT  --  257   BMET Recent Labs    12/22/21 0359 12/22/21 0419  NA 137 136  K 4.2 4.2  CL  --  103  CO2  --  25  GLUCOSE  --  160*  BUN  --  12  CREATININE  --  1.34*  CALCIUM  --  9.2    Studies/Results: DG Cervical Spine 1 View  Result Date: 12/21/2021 CLINICAL DATA:  Surgery, elective Z41.9 (ICD-10-CM) EXAM: DG CERVICAL SPINE - 1 VIEW COMPARISON:  MRI November 15, 2021 FINDINGS: Fluoro time: 6.1 seconds. Reported radiation: 0.81 mGy. A single C-arm fluoroscopic images was obtained intraoperatively and submitted for post operative interpretation. Postoperative changes of C4-C6 ACDF with intervening spacers. No evidence of immediate hardware complication. Straightening of the normal cervical lordosis without visible sagittal subluxation. Cervicothoracic junction is obscured by overlapping shoulders. Please see the performing provider's  procedural report for further detail. IMPRESSION: Intraoperative fluoroscopy, as detailed above. Electronically Signed   By: Margaretha Sheffield M.D.   On: 12/21/2021 10:24   Portable Chest xray  Result Date: 12/22/2021 CLINICAL DATA:  Respiratory failure EXAM: PORTABLE CHEST 1 VIEW COMPARISON:  09/09/2021 FINDINGS: Endotracheal tube is 4.5 cm above the carina. Lung volumes are low. Lungs are clear. No pleural effusion or pneumothorax. Top-normal heart size. IMPRESSION: No acute process in the chest. Electronically Signed   By: Macy Mis M.D.   On: 12/22/2021 08:26    Assessment/Plan: Patient underwent a C4-5, C5-6 ACDF on 12/21/2021 with Dr. Annette Stable. He developed post op hematoma with SOB and difficulty speaking the evening of 12/21/2021. Dr. Kathyrn Sheriff performed a wound exploration and evacuation of the hematoma. The patient was extubated 12/22/2021 in the morning.   LOS: 0 days   -Leave Hemovac drain in one more night. -Give patient a little more time and then advance his diet as tolerated. -If patient remains stable overnight, plan is to discharge home tomorrow.   Viona Gilmore, DNP, AGNP-C Nurse Practitioner  [facility] [place] [address], [place], [place] [address] P: [phone]     F: [phone]  12/22/2021, 3:05 PM

## 2021-12-22 NOTE — Progress Notes (Signed)
Postop day 1.  Unfortunately patient developed postoperative hematoma last night with some difficulty with maintaining his airway.  No history of significant hypoxia or true respiratory distress however.  Patient taken to the operating room where he was successfully intubated and evacuation of hematoma was performed by Dr. Kathyrn Sheriff.  Postoperatively has been stable.  He is remained intubated overnight.  He is still on sedation.  On exam he is sedated.  He opens his eyes to voice.  He follows commands bilaterally.  His neck is soft.  His airway is midline.  Oxygen saturations are 100%.  Overall progressing well.  Hopefully can be weaned to extubation rapidly this morning.  Once extubated okay to mobilize and monitor for further airway issues.  Possible discharge tomorrow if everything goes well.

## 2021-12-22 NOTE — Progress Notes (Addendum)
NAME:  Ricky Cook, MRN:  [id number], DOB:  1986/12/14, LOS: 0 ADMISSION DATE:  12/21/2021, CONSULTATION DATE:  12/22/21 REFERRING MD:  Conchita Paris CHIEF COMPLAINT:  Vent Management   History of Present Illness:  Ricky Cook is a 36 y.o. male who has a PMH as below.  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  He underwent surgery that day and had C4-5, C5-6 anterior cervical discectomy with interbody fusion utilizing interbody cages, local harvested autograft, and anterior plate instrumentation.  Later that night, he had dyspnea and dysphagia.  He was taken back to OR emergently where he had exploration of neck wound and evacuation of hematoma.  Post surgery, he remained on the vent and PCCM was asked to assist with vent management.  Of note, he had incidental finding of COVID positive on 12/30.  Pertinent  Medical History:  has Cervical spondylosis with myelopathy and radiculopathy; Surgery, elective; Respiratory failure (HCC); and Hematoma of neck on their problem list.  Significant Hospital Events: Including procedures, antibiotic start and stop dates in addition to other pertinent events   1/3 admit for elective ACDF 1/4 back to OR for neck hematoma intubated; will attempt to extubate  Interim History / Subjective:  On vent SBT Weaning off propofol Awake; following commands  Objective:  Blood pressure (!) 156/76, pulse 100, temperature 99.6 F (37.6 C), temperature source Axillary, resp. rate 17, height 5' 11.5" (1.816 m), weight 121.6 kg, SpO2 100 %.    Vent Mode: PSV;CPAP FiO2 (%):  [21 %-70 %] 40 % Set Rate:  [18 bmp] 18 bmp Vt Set:  [610 mL] 610 mL PEEP:  [5 cmH20] 5 cmH20 Pressure Support:  [8 cmH20] 8 cmH20 Plateau  Pressure:  [12 cmH20] 12 cmH20   Intake/Output Summary (Last 24 hours) at 12/22/2021 0824 Last data filed at 12/22/2021 0800 Gross per 24 hour  Intake 3366 ml  Output 3500 ml  Net -134 ml    Filed Weights   12/21/21 0653  Weight: 121.6 kg    Examination: General:  ill appearing on mech vent HEENT: MM pink/moist; ETT in place; neck dressing in place Neuro: Aox3; MAE; sedated CV: s1s2, no m/r/g PULM:  dim clear BS bilaterally; on mech vent PRVC GI: soft, bsx4 active  Extremities: warm/dry, no edema  Skin: no rashes or lesions   Labs/imaging personally reviewed:   Assessment & Plan:   -R C4-5 disck herniation with compression of right C5 nerve root and severe stenosis at C4-5 with disc herniation at C5-6 with severe cord compression - s/p C4-5, C5-6 anterior cervical discectomy with interbody fusion utilizing interbody cages, local harvested autograft, and anterior plate instrumentation 1/3. -Neck / surgical hematoma post op - s/p wound exploration and evacuation of hematoma 1/4 P: - per neurosurgery  Post op vent management - in setting above. P: -will wean off sedation and attempt SBT -goal for extubation today -pulm hygiene: IS, OOB -PT/OT  Incidentally COVID positive - appears he was asymptomatic. - Supportive care -pulm hygiene  Hx HTN. P: -continue home amlodipine, losartan -SBP goal <140 per neurosurgery -prn hydralazine and labetolol  Best practice (evaluated daily):  Diet/type: NPO; swallow eval post extubation DVT prophylaxis: other; SCDs; per neurosurgery GI prophylaxis: PPI Lines: N/A Foley:  N/A Code  Status:  full code Last date of multidisciplinary goals of care discussion: 1/4 updated patient and dad at bedside   Critical care time: 35 min.   Ricky Anselm Lis [facility] 12/22/2021, 8:30 AM  Please see [email or web address] for pager details.  From 7A-7P if no response, please call [phone]. After hours, please call ELink  [phone].

## 2021-12-22 NOTE — Progress Notes (Signed)
Rapid Response Event Note   Reason for Call :  Neck swelling, SOB  Initial Focused Assessment:   Pt sitting in chair, c/o SOB and difficulty breathing. Neck firm and swollen. Incision clean dry intact. SpO2 94% room air, difficulty speaking.    Interventions:  Paged Neuro Surgery  Plan of Care:  Intubation and OR possibly need ICU bed after.     Zenaida Niece, RN

## 2021-12-22 NOTE — Consult Note (Signed)
NAME:  Ricky Cook, MRN:  [id number], DOB:  Mar 23, 1986, LOS: 0 ADMISSION DATE:  12/21/2021, CONSULTATION DATE:  12/22/21 REFERRING MD:  Conchita Paris CHIEF COMPLAINT:  Vent Management   History of Present Illness:  Ricky Cook is a 36 y.o. male who has a PMH as below.  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  He underwent surgery that day and had C4-5, C5-6 anterior cervical discectomy with interbody fusion utilizing interbody cages, local harvested autograft, and anterior plate instrumentation.  Later that night, he had dyspnea and dysphagia.  He was taken back to OR emergently where he had exploration of neck wound and evacuation of hematoma.  Post surgery, he remained on the vent and PCCM was asked to assist with vent management.  Of note, he had incidental finding of COVID positive on 12/30.  Pertinent  Medical History:  has Cervical spondylosis with myelopathy and radiculopathy and Encounter for intubation on their problem list.  Significant Hospital Events: Including procedures, antibiotic start and stop dates in addition to other pertinent events   1/3 admit for elective ACDF 1/4 back to OR for neck hematoma  Interim History / Subjective:  On vent, sedated.  Objective:  Blood pressure (!) 145/92, pulse 69, temperature 98.6 F (37 C), temperature source Oral, resp. rate 18, height 5' 11.5" (1.816 m), weight 121.6 kg, SpO2 100 %.    Vent Mode: PRVC FiO2 (%):  [21 %-70 %] 70 % Set Rate:  [18 bmp] 18 bmp Vt Set:  [610 mL] 610 mL PEEP:  [5 cmH20] 5 cmH20 Plateau Pressure:  [12 cmH20] 12 cmH20   Intake/Output Summary (Last 24 hours) at 12/22/2021 0333 Last data filed at 12/22/2021 0122 Gross per 24 hour  Intake 3220 ml  Output 2400  ml  Net 820 ml   Filed Weights   12/21/21 0653  Weight: 121.6 kg    Examination (performed from doorway with assistance of RN): General: Adult male, resting in bed, in NAD. Neuro: Sedated on vent. HEENT: Neck dressings C/D/I. Sclerae anicteric. ETT in place. Cardiovascular: RRR on monitor.  Lungs: Respirations even and unlabored.  CTA bilaterally per RN. Abdomen: BS x 4, soft, NT/ND.  Musculoskeletal: No gross deformities, no edema.  Skin: Intact, warm, no rashes.  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 - Post op care per neurosurgery.  Neck / surgical hematoma post op - s/p wound exploration and evacuation of hematoma 1/4. - Post op care per neurosurgery.  Post op vent management - in setting above. - Full vent support. - Assess in AM for possible extubation if neck wound is stable / no further edema / hematoma. - Bronchial hygiene. - Follow CXR.  Incidentally COVID positive - appears he was asymptomatic. - Supportive care.  Hx HTN. - Continue home Amlodipine, Losartan. - Hydralazine PRN. - Goal SBP < 140 per neurosurgery.  Best practice (evaluated daily):  Diet/type: NPO DVT prophylaxis: other GI prophylaxis: PPI Lines: N/A Foley:  N/A Code  Status:  full code Last date of multidisciplinary goals of care discussion: None yet.  Labs   CBC: Recent Labs  Lab 12/15/21 1600  WBC 6.2  HGB 14.9  HCT 45.9  MCV 87.4  PLT 250    Basic Metabolic Panel: Recent Labs  Lab 12/15/21 1600  NA 139  K 4.0  CL 104  CO2 28  GLUCOSE 89  BUN 11  CREATININE 1.13  CALCIUM 9.2   GFR: Estimated Creatinine Clearance: 122 mL/min (by C-G  formula based on SCr of 1.13 mg/dL). Recent Labs  Lab 12/15/21 1600  WBC 6.2    Liver Function Tests: No results for input(s): AST, ALT, ALKPHOS, BILITOT, PROT, ALBUMIN in the last 168 hours. No results for input(s): LIPASE, AMYLASE in the last 168 hours. No results for input(s): AMMONIA in the last 168 hours.  ABG    Component Value Date/Time   TCO2 26 05/28/2012 0753     Coagulation Profile: No results for input(s): INR, PROTIME in the last 168 hours.  Cardiac Enzymes: No results for input(s): CKTOTAL, CKMB, CKMBINDEX, TROPONINI in the last 168 hours.  HbA1C: No results found for: HGBA1C  CBG: No results for input(s): GLUCAP in the last 168 hours.  Review of Systems:   Unable to obtain as pt is encephalopathic.  Past Medical History:  He,  has a past medical history of Asthma, GERD (gastroesophageal reflux disease), and Hypertension.   Surgical History:   Past Surgical History:  Procedure Laterality Date   TONSILLECTOMY       Social History:   reports that he has been smoking cigarettes. He has been smoking an average of .25 packs per day. He has never used smokeless tobacco. He reports that he does not currently use drugs. He reports that he does not drink alcohol.   Family History:  His family history includes Diabetes in his mother; Hypercholesterolemia in his father; Hypertension in his mother.   Allergies Allergies  Allergen Reactions   Ceftin [Cefuroxime Axetil] Shortness Of Breath   Augmentin [Amoxicillin-Pot Clavulanate] Other (See Comments)    thrush   Sulfa Antibiotics Other (See Comments)    Thrush     Home Medications  Prior to Admission medications   Medication Sig Start Date End Date Taking? Authorizing Provider  albuterol (VENTOLIN HFA) 108 (90 Base) MCG/ACT inhaler Inhale 2 puffs into the lungs every 6 (six) hours as needed for wheezing or shortness of breath.   Yes [provider]  amLODipine (NORVASC) 5 MG tablet Take 10 mg  by mouth daily. 10/05/21  Yes [provider]  cetirizine (ZYRTEC) 10 MG tablet Take 10 mg by mouth daily.   Yes [provider]  famotidine (PEPCID) 20 MG tablet Take 20 mg by mouth daily. 10/05/21  Yes [provider]  losartan (COZAAR) 100 MG tablet Take 100 mg by mouth daily. 10/05/21  Yes [provider]  metroNIDAZOLE (FLAGYL) 500 MG tablet Take 1 tablet (500 mg total) by mouth 2 (two) times daily. Patient not taking: Reported on 12/07/2021 11/13/20   Merrilee JanskyLamptey, Philip O, MD  predniSONE (DELTASONE) 20 MG tablet Day 1-3: Take 3 tablets daily. Day 4-6: Take 2 tablets daily. Day 7-9: Take 1 tablet daily. Take tablets daily with breakfast. Patient not taking: Reported on 12/07/2021 10/15/21   Wallis BambergMani, Mario, PA-C  tiZANidine (ZANAFLEX) 4 MG tablet Take 1 tablet (4 mg total) by mouth at bedtime. Patient not taking: Reported on 12/08/2021 10/15/21   Wallis BambergMani, Mario, PA-C  Critical care time: 30 min.   Rutherford Guys, PA - [facility] For pager details, please see AMION or use Epic chat  After 1900, please call [facility] for cross coverage needs 12/22/2021, 3:33 AM

## 2021-12-22 NOTE — Evaluation (Signed)
Physical Therapy Evaluation Patient Details Name: Ricky Cook MRN: [id number] DOB: 06/18/1986 Today's Date: 12/22/2021  History of Present Illness  36 y.o. 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    He underwent surgery C4-5, C5-6 ACDF on 12/21/21.  Later that night, he had dyspnea and dysphagia.  He was taken back to OR emergently where he had exploration of neck wound and evacuation of hematoma.   Post surgery, he remained on the vent, and was extubated 1/4.  Of note, he had incidental finding of COVID positive on 12/30.  Clinical Impression  Pt admitted with above diagnosis. At baseline, pt independent and working.  He will have family support at d/c.  Pt tolerated therapy well with VSS on RA and good pain control. Pt had received pain meds prior to therapy and was lethargic needing some cues for safety.  He ambulated multiple laps in room using RW , min g, and min cues.  Expected to progress well with no therapy needs at d/c.  Pt currently with functional limitations due to the deficits listed below (see PT Problem List). Pt will benefit from skilled PT to increase their independence and safety with mobility to allow discharge to the venue listed below.          Recommendations for follow up therapy are one component of a multi-disciplinary discharge planning process, led by the attending physician.  Recommendations may be updated based on patient status, additional functional criteria and insurance authorization.  Follow Up Recommendations No PT follow up    Assistance Recommended at Discharge Intermittent Supervision/Assistance  Patient can return home with the following  A little help with walking and/or transfers;A little help with  bathing/dressing/bathroom;Assistance with cooking/housework    Equipment Recommendations Rolling walker (2 wheels) (may progress past needing RW)  Recommendations for Other Services       Functional Status Assessment Patient has had a recent decline in their functional status and demonstrates the ability to make significant improvements in function in a reasonable and predictable amount of time.     Precautions / Restrictions Precautions Precautions: Cervical Precaution Booklet Issued: No (too lethargic did not issue) Required Braces or Orthoses: Cervical Brace Cervical Brace: Soft collar;For comfort Restrictions Weight Bearing Restrictions: No Other Position/Activity Restrictions: C collar was lost during his transition from 5N      Mobility  Bed Mobility Overal bed mobility: Needs Assistance Bed Mobility: Sit to Supine     Supine to sit: Supervision;HOB elevated Sit to supine: Supervision;HOB elevated        Transfers Overall transfer level: Needs assistance Equipment used: Rolling walker (2 wheels) Transfers: Sit to/from Stand Sit to Stand: Min guard   Step pivot transfers: Min guard            Ambulation/Gait Ambulation/Gait assistance: Min guard Gait Distance (Feet): 150 Feet Assistive device: Rolling walker (2 wheels) Gait Pattern/deviations: Step-through pattern;Decreased stride length Gait velocity: decreased     General Gait Details: ambulated laps in room with min cues for safety and RW use  Stairs            Wheelchair Mobility    Modified Rankin (Stroke Patients Only)       Balance Overall balance assessment: Needs assistance Sitting-balance support: Feet supported;No  upper extremity supported Sitting balance-Leahy Scale: Good Sitting balance - Comments: very rigid posture - fearful of hurting something   Standing balance support: Bilateral upper extremity supported;No upper extremity supported Standing balance-Leahy Scale:  Fair Standing balance comment: RW to ambulate but could static stand no AD                             Pertinent Vitals/Pain Pain Assessment: No/denies pain Faces Pain Scale: Hurts a little bit Pain Location: neck Pain Descriptors / Indicators: Tender;Tightness Pain Intervention(s): Monitored during session    Home Living Family/patient expects to be discharged to:: Private residence Living Arrangements: Parent Available Help at Discharge: Family;Available 24 hours/day Type of Home: House Home Access: Stairs to enter Entrance Stairs-Rails: Doctor, general practice of Steps: 4   Home Layout: One level Home Equipment: None      Prior Function Prior Level of Function : Independent/Modified Independent;Working/employed;Driving             Mobility Comments: works as Engineer, petroleum: Right    Extremity/Trunk Assessment   Upper Extremity Assessment Upper Extremity Assessment: Defer to OT evaluation    Lower Extremity Assessment Lower Extremity Assessment: Overall WFL for tasks assessed    Cervical / Trunk Assessment Cervical / Trunk Assessment: Neck Surgery  Communication   Communication: No difficulties  Cognition Arousal/Alertness: Lethargic Behavior During Therapy: WFL for tasks assessed/performed Overall Cognitive Status: Within Functional Limits for tasks assessed                                 General Comments: Pt had received IV pain meds about 15 mins prior to PT and was very lethargic and needing cues for safety at times.  Pt good adhearance to neck prec        General Comments General comments (skin integrity, edema, etc.): Educated on neck prec.  VSS    Exercises     Assessment/Plan    PT Assessment Patient needs continued PT services  PT Problem List Decreased strength;Decreased mobility;Decreased safety awareness;Decreased knowledge of precautions;Decreased activity  tolerance;Decreased balance;Decreased range of motion       PT Treatment Interventions DME instruction;Therapeutic activities;Modalities;Gait training;Therapeutic exercise;Patient/family education;Stair training;Balance training;Functional mobility training    PT Goals (Current goals can be found in the Care Plan section)  Acute Rehab PT Goals Patient Stated Goal: return home and to work ASAP PT Goal Formulation: With patient/family Time For Goal Achievement: 01/05/22 Potential to Achieve Goals: Good    Frequency Min 5X/week     Co-evaluation               AM-PAC PT "6 Clicks" Mobility  Outcome Measure Help needed turning from your back to your side while in a flat bed without using bedrails?: A Little Help needed moving from lying on your back to sitting on the side of a flat bed without using bedrails?: A Little Help needed moving to and from a bed to a chair (including a wheelchair)?: A Little Help needed standing up from a chair using your arms (e.g., wheelchair or bedside chair)?: A Little Help needed to walk in hospital room?: A Little Help needed climbing 3-5 steps with a railing? : A Little 6 Click Score: 18    End of Session Equipment Utilized During Treatment: Gait belt Activity Tolerance: Patient tolerated treatment  well Patient left: in bed;with call bell/phone within reach;with bed alarm set Nurse Communication: Mobility status PT Visit Diagnosis: Other abnormalities of gait and mobility (R26.89)    Time: 5732-2025 PT Time Calculation (min) (ACUTE ONLY): 29 min   Charges:   PT Evaluation $PT Eval Low Complexity: 1 Low PT Treatments $Gait Training: 8-22 mins        Anise Salvo, PT Acute Rehab Services Pager [phone] [facility] [phone]   Ricky Cook 12/22/2021, 5:44 PM

## 2021-12-23 LAB — BASIC METABOLIC PANEL
Anion gap: 5 (ref 5–15)
BUN: 12 mg/dL (ref 6–20)
CO2: 28 mmol/L (ref 22–32)
Calcium: 8.9 mg/dL (ref 8.9–10.3)
Chloride: 104 mmol/L (ref 98–111)
Creatinine, Ser: 1.3 mg/dL — ABNORMAL HIGH (ref 0.61–1.24)
GFR, Estimated: >60 mL/min (ref >60)
Glucose, Bld: 147 mg/dL — ABNORMAL HIGH (ref 70–99)
Potassium: 3.6 mmol/L (ref 3.5–5.1)
Sodium: 137 mmol/L (ref 135–145)

## 2021-12-23 LAB — CBC
HCT: 35.9 % — ABNORMAL LOW (ref 39.0–52.0)
Hemoglobin: 11.9 g/dL — ABNORMAL LOW (ref 13.0–17.0)
MCH: 29.2 pg (ref 26.0–34.0)
MCHC: 33.1 g/dL (ref 30.0–36.0)
MCV: 88 fL (ref 80.0–100.0)
Platelets: 244 10*3/uL (ref 150–400)
RBC: 4.08 MIL/uL — ABNORMAL LOW (ref 4.22–5.81)
RDW: 14.6 % (ref 11.5–15.5)
WBC: 9.6 10*3/uL (ref 4.0–10.5)
nRBC: 0 % (ref 0.0–0.2)

## 2021-12-23 LAB — TRIGLYCERIDES: Triglycerides: 111 mg/dL (ref <150)

## 2021-12-23 MED ORDER — HYDROCODONE-ACETAMINOPHEN 5-325 MG PO TABS: 1.0000 | ORAL_TABLET | ORAL | 0 refills | Status: DC | PRN

## 2021-12-23 MED ORDER — CYCLOBENZAPRINE HCL 10 MG PO TABS: 10.0000 mg | ORAL_TABLET | Freq: Three times a day (TID) | ORAL | 0 refills | Status: DC | PRN

## 2021-12-23 MED FILL — Thrombin (Recombinant) For Soln 5000 Unit: CUTANEOUS | Qty: 5000 | Status: AC

## 2021-12-23 NOTE — Progress Notes (Signed)
Physical Therapy Treatment Patient Details Name: Ricky Cook MRN: [id number] DOB: [birth year] Today's Date: 12/23/2021   History of Present Illness 36 y.o. 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    He underwent surgery C4-5, C5-6 ACDF on 12/21/21.  Later that night, he had dyspnea and dysphagia.  He was taken back to OR emergently where he had exploration of neck wound and evacuation of hematoma.   Post surgery, he remained on the vent, and was extubated 1/4.  Of note, he had incidental finding of COVID positive on 12/30.    PT Comments    Patient fearful of movement OOB initially. Patient requires minA for bed mobility and supervision for transfers and ambulation. Able to recall 3/3 cervical precautions without cueing. Patient prefers use of RW for comfort. Notified RN of need for soft collar for comfort and patient requesting to wear for mobility. No PT follow up recommended at this time.     Recommendations for follow up therapy are one component of a multi-disciplinary discharge planning process, led by the attending physician.  Recommendations may be updated based on patient status, additional functional criteria and insurance authorization.  Follow Up Recommendations  No PT follow up     Assistance Recommended at Discharge Intermittent Supervision/Assistance  Patient can return home with the following A little help with walking and/or transfers;A little help with bathing/dressing/bathroom;Assistance with cooking/housework   Equipment Recommendations  Rolling Farrie Sann (2 wheels)    Recommendations for Other Services       Precautions / Restrictions Precautions Precautions: Cervical Precaution Booklet Issued: No Precaution Comments:  verbally reviewed and patient able to recall 3/3 Required Braces or Orthoses: Cervical Brace Cervical Brace: Soft collar;For comfort Restrictions Weight Bearing Restrictions: No     Mobility  Bed Mobility Overal bed mobility: Needs Assistance Bed Mobility: Rolling;Sidelying to Sit Rolling: Min guard Sidelying to sit: Min assist       General bed mobility comments: minA for trunk elevation due to fear of "breaking my neck"    Transfers Overall transfer level: Needs assistance Equipment used: Rolling Berenise Hunton (2 wheels) Transfers: Sit to/from Stand Sit to Stand: Supervision                Ambulation/Gait Ambulation/Gait assistance: Supervision Gait Distance (Feet): 150 Feet Assistive device: Rolling Galadriel Shroff (2 wheels) Gait Pattern/deviations: Step-through pattern;Decreased stride length Gait velocity: decreased     General Gait Details: ambulated laps in the room with supervision   Stairs             Wheelchair Mobility    Modified Rankin (Stroke Patients Only)       Balance Overall balance assessment: Needs assistance Sitting-balance support: Feet supported;No upper extremity supported Sitting balance-Leahy Scale: Good Sitting balance - Comments: guarded posture   Standing balance support: Bilateral upper extremity supported;During functional activity Standing balance-Leahy Scale: Fair Standing balance comment: reliant on UE support                            Cognition Arousal/Alertness: Awake/alert Behavior During Therapy: Anxious Overall Cognitive Status: Within Functional Limits for tasks assessed  Exercises      General Comments        Pertinent Vitals/Pain Pain Assessment: Faces Faces Pain Scale: Hurts little more Pain Location: neck Pain Descriptors / Indicators: Tender;Tightness Pain Intervention(s): Monitored during session;Repositioned    Home Living                           Prior Function            PT Goals (current goals can now be found in the care plan section) Acute Rehab PT Goals Patient Stated Goal: return home and to work ASAP PT Goal Formulation: With patient/family Time For Goal Achievement: 01/05/22 Potential to Achieve Goals: Good Progress towards PT goals: Progressing toward goals    Frequency    Min 5X/week      PT Plan Current plan remains appropriate    Co-evaluation              AM-PAC PT "6 Clicks" Mobility   Outcome Measure  Help needed turning from your back to your side while in a flat bed without using bedrails?: A Little Help needed moving from lying on your back to sitting on the side of a flat bed without using bedrails?: A Little Help needed moving to and from a bed to a chair (including a wheelchair)?: A Little Help needed standing up from a chair using your arms (e.g., wheelchair or bedside chair)?: A Little Help needed to walk in hospital room?: A Little Help needed climbing 3-5 steps with a railing? : A Little 6 Click Score: 18    End of Session   Activity Tolerance: Patient tolerated treatment well Patient left: in chair;with call bell/phone within reach;with chair alarm set Nurse Communication: Mobility status PT Visit Diagnosis: Other abnormalities of gait and mobility (R26.89)     Time: [id number] PT Time Calculation (min) (ACUTE ONLY): 28 min  Charges:  $Gait Training: 23-37 mins                     Saraphina Lauderbaugh A. Gilford Rile PT, DPT Acute Rehabilitation Services Pager [phone] Office [phone]    Linna Hoff 12/23/2021, 9:33 AM

## 2021-12-23 NOTE — Discharge Instructions (Signed)

## 2021-12-23 NOTE — Progress Notes (Signed)
Orthopedic Tech Progress Note Patient Details:  Ricky Cook [birth year] [id number]  Kristianna, RN, was headed into room as we got there and offered to apply it as she was already donning all COVID PPE.  Ortho Devices Type of Ortho Device: Soft collar Ortho Device/Splint Location: handed to RN going into room Ortho Device/Splint Interventions: Ordered   Post Interventions Patient Tolerated: Well Instructions Provided: Care of device  Mehreen Azizi Carmine Savoy 12/23/2021, 12:07 PM

## 2021-12-23 NOTE — Progress Notes (Signed)
Occupational Therapy Treatment Patient Details Name: Ricky Cook MRN: [id number] DOB: Mar 11, 1986 Today's Date: 12/23/2021   History of present illness 36 y.o. 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    He underwent surgery C4-5, C5-6 ACDF on 12/21/21.  Later that night, he had dyspnea and dysphagia.  He was taken back to OR emergently where he had exploration of neck wound and evacuation of hematoma.   Post surgery, he remained on the vent, and was extubated 1/4.  Of note, he had incidental finding of COVID positive on 12/30.   OT comments  Patient preparing for discharge this date.  Able to recall cervical precautions, and no assist needed for sink bath and dressing task from sit/stand level.  OT will follow if he remains in the acute setting, however, OT recommends home with PRN assist as needed from family.     Recommendations for follow up therapy are one component of a multi-disciplinary discharge planning process, led by the attending physician.  Recommendations may be updated based on patient status, additional functional criteria and insurance authorization.    Follow Up Recommendations  No OT follow up    Assistance Recommended at Discharge PRN  Patient can return home with the following   PRN   Equipment Recommendations    None   Recommendations for Other Services      Precautions / Restrictions Precautions Precautions: Cervical Precaution Booklet Issued: No Precaution Comments: verbally reviewed and patient able to recall 3/3 Required Braces or Orthoses: Cervical Brace Cervical Brace: Soft collar;For comfort Restrictions Weight Bearing Restrictions: No Other Position/Activity Restrictions: C collar was lost during his  transition from 5N       Mobility Bed Mobility Overal bed mobility: Needs Assistance Bed Mobility: Rolling;Sidelying to Sit Rolling: Min guard Sidelying to sit: Min assist       General bed mobility comments: minA for trunk elevation due to fear of "breaking my neck"    Transfers Overall transfer level: Needs assistance Equipment used: Rolling walker (2 wheels) Transfers: Sit to/from Stand Sit to Stand: Modified independent (Device/Increase time)                 Balance Overall balance assessment: Mild deficits observed, not formally tested Sitting-balance support: Feet supported;No upper extremity supported Sitting balance-Leahy Scale: Good Sitting balance - Comments: guarded posture   Standing balance support: Bilateral upper extremity supported;During functional activity Standing balance-Leahy Scale: Fair Standing balance comment: reliant on UE support                           ADL either performed or assessed with clinical judgement   ADL Overall ADL's : Modified independent                                              Cognition Arousal/Alertness: Awake/alert Behavior During Therapy: Anxious Overall Cognitive Status: Within Functional Limits for tasks assessed  Pertinent Vitals/ Pain       Pain Assessment: Faces Faces Pain Scale: Hurts a little bit Pain Location: neck Pain Descriptors / Indicators: Tender;Tightness Pain Intervention(s): Monitored during session;Premedicated before session                                                          Frequency  Min 2X/week        Progress Toward Goals  OT Goals(current goals can now be found in the care plan section)  Progress towards OT goals: Progressing toward goals  Acute Rehab OT Goals Time For Goal Achievement: 01/05/22 Potential to Achieve Goals:  Good  Plan      Co-evaluation                 AM-PAC OT "6 Clicks" Daily Activity     Outcome Measure   Help from another person eating meals?: None Help from another person taking care of personal grooming?: None Help from another person toileting, which includes using toliet, bedpan, or urinal?: None Help from another person bathing (including washing, rinsing, drying)?: None Help from another person to put on and taking off regular upper body clothing?: None Help from another person to put on and taking off regular lower body clothing?: None 6 Click Score: 24    End of Session Equipment Utilized During Treatment: Rolling walker (2 wheels)  OT Visit Diagnosis: Pain   Activity Tolerance Patient tolerated treatment well   Patient Left in chair;with call bell/phone within reach   Nurse Communication          Time: 1005-1031 OT Time Calculation (min): 26 min  Charges: OT General Charges $OT Visit: 1 Visit OT Treatments $Self Care/Home Management : 23-37 mins  12/23/2021  RP, OTR/L  Acute Rehabilitation Services  Office:  [phone]   Suzanna Obey 12/23/2021, 10:35 AM

## 2021-12-23 NOTE — Progress Notes (Signed)
D/C education given to Pt and all questions answered. No printed prescriptions to give, equipment delivered to room prior to D/C. IV removed. Pt taken to car with all belongings.

## 2021-12-23 NOTE — Discharge Summary (Signed)
Physician Discharge Summary  Patient ID: Ricky Cook MRN: [id number] DOB/AGE: 1986/11/16 36 y.o.  Admit date: 12/21/2021 Discharge date: 12/23/2021  Admission Diagnoses:  Discharge Diagnoses:  Principal Problem:   Cervical spondylosis with myelopathy and radiculopathy Active Problems:   Surgery, elective   Respiratory failure (HCC)   Hematoma of neck   Incidental finding of COVID-19 virus infection   Discharged Condition: good  Hospital Course: Patient admitted to the hospital where he underwent uncomplicated C4-5 and C5-6 anterior cervical discectomy and fusion for treatment of his compressive cervical myelopathy and radiculopathy.  Postoperatively he awakened well with marked improvement of his right upper knee pain and weakness and good lower extremity function.  Unfortunately later the night of surgery the patient developed neck swelling secondary to hematoma with some early airway compromise.  Patient was taken back to the operating room where his neck was reexplored.  The hematoma was evacuated.  There was no evidence of any active bleeding or other complication.  Patient was kept intubated overnight where he had no further problems.  He was extubated the next day.  He was observed through the day.  He was standing voiding and swallowing well.  He feels ready for discharge this morning.  Consults:   Significant Diagnostic Studies:   Treatments:   Discharge Exam: Blood pressure (!) 143/73, pulse 75, temperature 99.2 F (37.3 C), temperature source Oral, resp. rate (!) 23, height 5' 11.5" (1.816 m), weight 121.6 kg, SpO2 95 %. Awake and alert.  Oriented and appropriate.  Speech and language intact.  Cranial nerve function normal by.  Motor examination 5/5 bilateral.  Sensory examination normal.  Wound clean and dry.  Chest and abdomen benign.  Disposition: Discharge disposition: 01-Home or Self Care        Allergies as of 12/23/2021       Reactions   Ceftin [cefuroxime  Axetil] Shortness Of Breath   Augmentin [amoxicillin-pot Clavulanate] Other (See Comments)   thrush   Sulfa Antibiotics Other (See Comments)   Thrush        Medication List     STOP taking these medications    metroNIDAZOLE 500 MG tablet Commonly known as: FLAGYL       TAKE these medications    albuterol 108 (90 Base) MCG/ACT inhaler Commonly known as: VENTOLIN HFA Inhale 2 puffs into the lungs every 6 (six) hours as needed for wheezing or shortness of breath.   amLODipine 5 MG tablet Commonly known as: NORVASC Take 10 mg by mouth daily.   cetirizine 10 MG tablet Commonly known as: ZYRTEC Take 10 mg by mouth daily.   cyclobenzaprine 10 MG tablet Commonly known as: FLEXERIL Take 1 tablet (10 mg total) by mouth 3 (three) times daily as needed for muscle spasms.   famotidine 20 MG tablet Commonly known as: PEPCID Take 20 mg by mouth daily.   HYDROcodone-acetaminophen 5-325 MG tablet Commonly known as: NORCO/VICODIN Take 1 tablet by mouth every 4 (four) hours as needed for moderate pain ((score 4 to 6)).   losartan 100 MG tablet Commonly known as: COZAAR Take 100 mg by mouth daily.   predniSONE 20 MG tablet Commonly known as: DELTASONE Day 1-3: Take 3 tablets daily. Day 4-6: Take 2 tablets daily. Day 7-9: Take 1 tablet daily. Take tablets daily with breakfast.   tiZANidine 4 MG tablet Commonly known as: Zanaflex Take 1 tablet (4 mg total) by mouth at bedtime.         Signed: Temple Pacini  12/23/2021, 8:28 AM

## 2021-12-28 ENCOUNTER — Encounter (HOSPITAL_COMMUNITY): Payer: Self-pay | Admitting: Neurosurgery

## 2021-12-28 ENCOUNTER — Observation Stay (HOSPITAL_COMMUNITY)
Admission: EM | Admit: 2021-12-28 | Discharge: 2021-12-29 | Disposition: A | Discharge status: Home / Self Care | Payer: Medicaid Other | Attending: Neurosurgery | Admitting: Neurosurgery

## 2021-12-28 ENCOUNTER — Other Ambulatory Visit: Payer: Self-pay

## 2021-12-28 ENCOUNTER — Emergency Department (HOSPITAL_COMMUNITY): Payer: Medicaid Other

## 2021-12-28 DIAGNOSIS — T148XXA Other injury of unspecified body region, initial encounter: Secondary | ICD-10-CM | POA: Diagnosis present

## 2021-12-28 DIAGNOSIS — Z79899 Other long term (current) drug therapy: Secondary | ICD-10-CM | POA: Diagnosis not present

## 2021-12-28 DIAGNOSIS — I10 Essential (primary) hypertension: Secondary | ICD-10-CM | POA: Insufficient documentation

## 2021-12-28 DIAGNOSIS — F17210 Nicotine dependence, cigarettes, uncomplicated: Secondary | ICD-10-CM | POA: Diagnosis not present

## 2021-12-28 DIAGNOSIS — J45909 Unspecified asthma, uncomplicated: Secondary | ICD-10-CM | POA: Insufficient documentation

## 2021-12-28 DIAGNOSIS — R221 Localized swelling, mass and lump, neck: Secondary | ICD-10-CM

## 2021-12-28 DIAGNOSIS — T819XXD Unspecified complication of procedure, subsequent encounter: Secondary | ICD-10-CM

## 2021-12-28 DIAGNOSIS — M96830 Postprocedural hemorrhage and hematoma of a musculoskeletal structure following a musculoskeletal system procedure: Secondary | ICD-10-CM | POA: Diagnosis not present

## 2021-12-28 LAB — TYPE AND SCREEN
ABO/RH(D): O POS
Antibody Screen: POSITIVE
Donor AG Type: NEGATIVE
Donor AG Type: NEGATIVE
PT AG Type: NEGATIVE
Unit division: 0
Unit division: 0

## 2021-12-28 LAB — BPAM RBC
Blood Product Expiration Date: 202301142359
Blood Product Expiration Date: 202301142359
ISSUE DATE / TIME: 202212181804
Unit Type and Rh: 5100
Unit Type and Rh: 5100

## 2021-12-28 LAB — CBC WITH DIFFERENTIAL/PLATELET
Abs Immature Granulocytes: 0.03 10*3/uL (ref 0.00–0.07)
Basophils Absolute: 0 10*3/uL (ref 0.0–0.1)
Basophils Relative: 0 %
Eosinophils Absolute: 0 10*3/uL (ref 0.0–0.5)
Eosinophils Relative: 0 %
HCT: 40.8 % (ref 39.0–52.0)
Hemoglobin: 12.9 g/dL — ABNORMAL LOW (ref 13.0–17.0)
Immature Granulocytes: 0 %
Lymphocytes Relative: 23 %
Lymphs Abs: 2.9 10*3/uL (ref 0.7–4.0)
MCH: 28.4 pg (ref 26.0–34.0)
MCHC: 31.6 g/dL (ref 30.0–36.0)
MCV: 89.9 fL (ref 80.0–100.0)
Monocytes Absolute: 0.9 10*3/uL (ref 0.1–1.0)
Monocytes Relative: 7 %
Neutro Abs: 8.7 10*3/uL — ABNORMAL HIGH (ref 1.7–7.7)
Neutrophils Relative %: 70 %
Platelets: 391 10*3/uL (ref 150–400)
RBC: 4.54 MIL/uL (ref 4.22–5.81)
RDW: 13.7 % (ref 11.5–15.5)
WBC: 12.5 10*3/uL — ABNORMAL HIGH (ref 4.0–10.5)
nRBC: 0 % (ref 0.0–0.2)

## 2021-12-28 LAB — COMPREHENSIVE METABOLIC PANEL
ALT: 74 U/L — ABNORMAL HIGH (ref 0–44)
AST: 29 U/L (ref 15–41)
Albumin: 3.6 g/dL (ref 3.5–5.0)
Alkaline Phosphatase: 55 U/L (ref 38–126)
Anion gap: 9 (ref 5–15)
BUN: 15 mg/dL (ref 6–20)
CO2: 29 mmol/L (ref 22–32)
Calcium: 9.4 mg/dL (ref 8.9–10.3)
Chloride: 101 mmol/L (ref 98–111)
Creatinine, Ser: 1.11 mg/dL (ref 0.61–1.24)
GFR, Estimated: >60 mL/min (ref >60)
Glucose, Bld: 113 mg/dL — ABNORMAL HIGH (ref 70–99)
Potassium: 4.5 mmol/L (ref 3.5–5.1)
Sodium: 139 mmol/L (ref 135–145)
Total Bilirubin: 0.7 mg/dL (ref 0.3–1.2)
Total Protein: 7.2 g/dL (ref 6.5–8.1)

## 2021-12-28 MED ORDER — ALBUTEROL SULFATE (2.5 MG/3ML) 0.083% IN NEBU: 2.5000 mg | INHALATION_SOLUTION | Freq: Four times a day (QID) | RESPIRATORY_TRACT | Status: DC | PRN

## 2021-12-28 MED ORDER — FAMOTIDINE 20 MG PO TABS
20.0000 mg | ORAL_TABLET | Freq: Every day | ORAL | Status: DC
Start: 2021-12-28 — End: 2021-12-29
  Administered 2021-12-28 – 2021-12-29 (×2): 20 mg via ORAL
  Filled 2021-12-28 (×2): qty 1

## 2021-12-28 MED ORDER — LORATADINE 10 MG PO TABS
10.0000 mg | ORAL_TABLET | Freq: Every day | ORAL | Status: DC
  Administered 2021-12-28 – 2021-12-29 (×2): 10 mg via ORAL
  Filled 2021-12-28 (×2): qty 1

## 2021-12-28 MED ORDER — SODIUM CHLORIDE 0.9 % IV SOLN
250.0000 mL | INTRAVENOUS | Status: DC
  Administered 2021-12-28: 250 mL via INTRAVENOUS

## 2021-12-28 MED ORDER — ALBUTEROL SULFATE HFA 108 (90 BASE) MCG/ACT IN AERS: 2.0000 | INHALATION_SPRAY | Freq: Four times a day (QID) | RESPIRATORY_TRACT | Status: DC | PRN

## 2021-12-28 MED ORDER — SODIUM CHLORIDE 0.9% FLUSH
3.0000 mL | Freq: Two times a day (BID) | INTRAVENOUS | Status: DC
  Administered 2021-12-28 – 2021-12-29 (×3): 3 mL via INTRAVENOUS

## 2021-12-28 MED ORDER — DEXAMETHASONE 4 MG PO TABS
4.0000 mg | ORAL_TABLET | Freq: Four times a day (QID) | ORAL | Status: DC
  Administered 2021-12-28 – 2021-12-29 (×5): 4 mg via ORAL
  Filled 2021-12-28 (×5): qty 1

## 2021-12-28 MED ORDER — OXYCODONE HCL 5 MG PO TABS
10.0000 mg | ORAL_TABLET | ORAL | Status: DC | PRN
  Administered 2021-12-28 – 2021-12-29 (×4): 10 mg via ORAL
  Filled 2021-12-28 (×4): qty 2

## 2021-12-28 MED ORDER — DEXAMETHASONE SODIUM PHOSPHATE 10 MG/ML IJ SOLN
10.0000 mg | Freq: Once | INTRAMUSCULAR | Status: AC
  Administered 2021-12-28: 10 mg via INTRAVENOUS
  Filled 2021-12-28: qty 1

## 2021-12-28 MED ORDER — HYDROMORPHONE HCL 1 MG/ML IJ SOLN
1.0000 mg | INTRAMUSCULAR | Status: DC | PRN
  Administered 2021-12-28 – 2021-12-29 (×3): 1 mg via INTRAVENOUS
  Filled 2021-12-28 (×3): qty 1

## 2021-12-28 MED ORDER — ACETAMINOPHEN 650 MG RE SUPP: 650.0000 mg | RECTAL | Status: DC | PRN

## 2021-12-28 MED ORDER — MENTHOL 3 MG MT LOZG: 1.0000 | LOZENGE | OROMUCOSAL | Status: DC | PRN

## 2021-12-28 MED ORDER — ONDANSETRON HCL 4 MG PO TABS: 4.0000 mg | ORAL_TABLET | Freq: Four times a day (QID) | ORAL | Status: DC | PRN

## 2021-12-28 MED ORDER — DEXAMETHASONE SODIUM PHOSPHATE 4 MG/ML IJ SOLN: 4.0000 mg | Freq: Four times a day (QID) | INTRAMUSCULAR | Status: DC

## 2021-12-28 MED ORDER — AMLODIPINE BESYLATE 10 MG PO TABS
10.0000 mg | ORAL_TABLET | Freq: Every day | ORAL | Status: DC
Start: 2021-12-28 — End: 2021-12-29
  Administered 2021-12-28 – 2021-12-29 (×2): 10 mg via ORAL
  Filled 2021-12-28: qty 1
  Filled 2021-12-28: qty 2

## 2021-12-28 MED ORDER — SODIUM CHLORIDE 0.9% FLUSH
3.0000 mL | INTRAVENOUS | Status: DC | PRN
  Administered 2021-12-28: 3 mL via INTRAVENOUS

## 2021-12-28 MED ORDER — HYDROCODONE-ACETAMINOPHEN 5-325 MG PO TABS: 1.0000 | ORAL_TABLET | ORAL | Status: DC | PRN

## 2021-12-28 MED ORDER — ACETAMINOPHEN 325 MG PO TABS: 650.0000 mg | ORAL_TABLET | ORAL | Status: DC | PRN

## 2021-12-28 MED ORDER — CYCLOBENZAPRINE HCL 10 MG PO TABS
10.0000 mg | ORAL_TABLET | Freq: Three times a day (TID) | ORAL | Status: DC | PRN
  Administered 2021-12-28: 10 mg via ORAL
  Filled 2021-12-28: qty 1

## 2021-12-28 MED ORDER — MORPHINE SULFATE (PF) 2 MG/ML IV SOLN
2.0000 mg | Freq: Once | INTRAVENOUS | Status: AC
  Administered 2021-12-28: 2 mg via INTRAVENOUS
  Filled 2021-12-28: qty 1

## 2021-12-28 MED ORDER — LOSARTAN POTASSIUM 50 MG PO TABS
100.0000 mg | ORAL_TABLET | Freq: Every day | ORAL | Status: DC
  Administered 2021-12-28 – 2021-12-29 (×2): 100 mg via ORAL
  Filled 2021-12-28 (×2): qty 2

## 2021-12-28 MED ORDER — PHENOL 1.4 % MT LIQD: 1.0000 | OROMUCOSAL | Status: DC | PRN

## 2021-12-28 MED ORDER — SODIUM CHLORIDE 0.9 % IV SOLN: INTRAVENOUS | Status: DC

## 2021-12-28 MED ORDER — IOHEXOL 300 MG/ML  SOLN
75.0000 mL | Freq: Once | INTRAMUSCULAR | Status: AC | PRN
  Administered 2021-12-28: 75 mL via INTRAVENOUS

## 2021-12-28 MED ORDER — ONDANSETRON HCL 4 MG/2ML IJ SOLN: 4.0000 mg | Freq: Four times a day (QID) | INTRAMUSCULAR | Status: DC | PRN

## 2021-12-28 NOTE — ED Triage Notes (Signed)
Pt underwent cervical discectomy and fusion on 12/22/21 complicated by hematoma with concerns for airway was intubated and discharged 12/23/21. Today presents to ED with worsening throat swelling. Pt able to speak in full sentences in triage.

## 2021-12-28 NOTE — H&P (Signed)
Ricky Cook is an 36 y.o. male.   Chief Complaint: Neck swelling HPI: 36 year old male status post two-level anterior cervical discectomy and fusion complicated by early postoperative hematoma requiring reexploration and evacuation of hemorrhage postop day 1.  Patient presents now approximately 6 days status post surgery with recurrent neck swelling.  He has some moderate dysphagia.  He is not having any airway issues.  His upper extremity strength sensation remains greatly improved.  He is having no radiating pain numbness or weakness.  He is having no fevers or chills.  Past Medical History:  Diagnosis Date   Asthma    GERD (gastroesophageal reflux disease)    Hypertension     Past Surgical History:  Procedure Laterality Date   ANTERIOR CERVICAL DECOMP/DISCECTOMY FUSION N/A 12/21/2021   Procedure: ANTERIOR CERVICAL WOUND EXPLORATION WITH EVACUATION OF HEMATOMA;  Surgeon: Consuella Lose, MD;  Location: [facility];  Service: Neurosurgery;  Laterality: N/A;   ANTERIOR CERVICAL DECOMP/DISCECTOMY FUSION N/A 12/21/2021   Procedure: Anterior Cervical Decompression Fusion  - Cervical four-Cervical five - Cervical five-Cervical six;  Surgeon: Earnie Larsson, MD;  Location: [facility];  Service: Neurosurgery;  Laterality: N/A;   TONSILLECTOMY      Family History  Problem Relation Age of Onset   Hypertension Mother    Diabetes Mother    Hypercholesterolemia Father    Social History:  reports that he has been smoking cigarettes. He has been smoking an average of .25 packs per day. He has never used smokeless tobacco. He reports that he does not currently use drugs. He reports that he does not drink alcohol.  Allergies:  Allergies  Allergen Reactions   Ceftin [Cefuroxime Axetil] Shortness Of Breath   Augmentin [Amoxicillin-Pot Clavulanate] Other (See Comments)    thrush   Sulfa Antibiotics Other (See Comments)    Thrush    (Not in a hospital admission)   Results for orders placed or performed  during the hospital encounter of 12/28/21 (from the past 48 hour(s))  CBC with Differential/Platelet     Status: Abnormal   Collection Time: 12/28/21  2:57 AM  Result Value Ref Range   WBC 12.5 (H) 4.0 - 10.5 K/uL   RBC 4.54 4.22 - 5.81 MIL/uL   Hemoglobin 12.9 (L) 13.0 - 17.0 g/dL   HCT 40.8 39.0 - 52.0 %   MCV 89.9 80.0 - 100.0 fL   MCH 28.4 26.0 - 34.0 pg   MCHC 31.6 30.0 - 36.0 g/dL   RDW 13.7 11.5 - 15.5 %   Platelets 391 150 - 400 K/uL   nRBC 0.0 0.0 - 0.2 %   Neutrophils Relative % 70 %   Neutro Abs 8.7 (H) 1.7 - 7.7 K/uL   Lymphocytes Relative 23 %   Lymphs Abs 2.9 0.7 - 4.0 K/uL   Monocytes Relative 7 %   Monocytes Absolute 0.9 0.1 - 1.0 K/uL   Eosinophils Relative 0 %   Eosinophils Absolute 0.0 0.0 - 0.5 K/uL   Basophils Relative 0 %   Basophils Absolute 0.0 0.0 - 0.1 K/uL   Immature Granulocytes 0 %   Abs Immature Granulocytes 0.03 0.00 - 0.07 K/uL    Comment: Performed at [facility], [address], [place], [place] [address]   CT Soft Tissue Neck W Contrast  Result Date: 12/28/2021 CLINICAL DATA:  Initial evaluation for acute soft tissue swelling. EXAM: CT NECK WITH CONTRAST TECHNIQUE: Multidetector CT imaging of the neck was performed using the standard protocol following the bolus  administration of intravenous contrast. CONTRAST:  37mL OMNIPAQUE IOHEXOL 300 MG/ML  SOLN COMPARISON:  None. FINDINGS: Pharynx and larynx: Oral cavity within normal limits. Oropharynx and nasopharynx within normal limits. Postoperative changes from recent ACDF at C4-C6. Persistent retropharyngeal/prevertebral edema and swelling involving the right greater than left retropharyngeal space anterior to the discectomy site. Superimposed isodense collection measuring approximately 4.7 x 1.7 x 5.9 cm (transverse by AP by [email or web address]), likely hematoma given provided history. Mild mass effect on the supraglottic airway which is no more than mildly narrowed but remains patent at this time. Soft  tissue swelling and fullness extends along the operative approach at the right neck, likely blood products. Right sternocleidomastoid muscle is somewhat thickened. Postoperative stranding extends to involve the right submandibular space. No other convincing inflammatory changes to suggest acute infection at this time. Salivary glands: Salivary glands including the parotid and submandibular glands are within normal limits. Thyroid: Normal. Lymph nodes: No enlarged or pathologic adenopathy within the neck. Vascular: Normal intravascular enhancement seen throughout the neck. No visible active contrast extravasation. Limited intracranial: Unremarkable. Visualized orbits: Unremarkable. Mastoids and visualized paranasal sinuses: Visualized paranasal sinuses are largely clear. Visualized mastoids and middle ear cavities are well pneumatized and free of fluid. Skeleton: Recent ACDF at C4-C6. No visible hardware complication. No discrete or worrisome osseous lesions. Upper chest: 9 mm nodule present at the right upper lobe (series 4, image 122), indeterminate. Visualized upper chest demonstrates no other acute finding. Other: None. IMPRESSION: 1. Postoperative changes from recent ACDF at C4-C6. Persistent retropharyngeal/prevertebral edema and swelling with probable superimposed 4.7 x 1.7 x 5.9 cm isodense collection, likely hematoma given provided history. Mild mass effect on the supraglottic airway which remains patent at this time. 2. No other acute abnormality within the neck. 3. 9 mm right upper lobe pulmonary nodule, indeterminate. Consider one of the following in 3 months for both low-risk and high-risk individuals: (a) repeat chest CT, (b) follow-up PET-CT, or (c) tissue sampling. This recommendation follows the consensus statement: Guidelines for Management of Incidental Pulmonary Nodules Detected on CT Images: From the Fleischner Society 2017; Radiology 2017; 284:228-243. Electronically Signed   By: Jeannine Boga M.D.   On: 12/28/2021 03:57    Pertinent items noted in HPI and remainder of comprehensive ROS otherwise negative.  Blood pressure 127/80, pulse 73, temperature 98.2 F (36.8 C), temperature source Oral, resp. rate 17, SpO2 99 %.  Patient is awake and alert.  He is oriented and appropriate.  Speech is fluent.  Judgment insight are intact.  He does not appear to be in any distress.  He is breathing normally.  His airway is midline.  He does have a significant superficial wound hematoma which is moderately soft.  He swallows and clears his secretions well.  Motor and sensory function of the extremities is normal.  Chest and abdomen benign. Assessment/Plan Patient with postoperative cervical hematoma.  Most of the hematoma appears to be superficial and I do not see any significant airway compromise at present.  Plan to admit for observation with IV steroids.  Mallie Mussel A Laurina Fischl 12/28/2021, 7:56 AM

## 2021-12-28 NOTE — ED Provider Triage Note (Addendum)
Emergency Medicine Provider Triage Evaluation Note  BRENTEN JANNEY , a 36 y.o. male  was evaluated in triage.  Pt complains of difficulty breathing and swallowing.  Had recent cervical discectomy complicated by hematoma that lead to subsequent intubation and draining of the hematoma in the OR.  He returns today for the same.    Review of Systems  Positive: Neck swelling, difficulty breathing and swallowing Negative: Fever, chills  Physical Exam  BP (!) 149/95 (BP Location: Right Arm)    Pulse 100    Temp 98.9 F (37.2 C)    Resp 17    SpO2 97%  Gen:   Awake, no distress   Resp:  Normal effort  MSK:   Moves extremities without difficulty  Other:  Neck swelling  Medical Decision Making  Medically screening exam initiated at 2:43 AM.  Appropriate orders placed.  KALLAN MERRICK was informed that the remainder of the evaluation will be completed by another provider, this initial triage assessment does not replace that evaluation, and the importance of remaining in the ED until their evaluation is complete.  Neck swelling.  CT soft tissue neck ordered.  Discussed with CN, working on getting room.   Called CT to expedite scan.    Roxy Horseman, PA-C 12/28/21 0245    Roxy Horseman, PA-C 12/28/21 [phone]

## 2021-12-28 NOTE — ED Provider Notes (Signed)
[facility] EMERGENCY DEPARTMENT Provider Note   CSN: [id number] Arrival date & time: 12/28/21  0217     History  Chief Complaint  Patient presents with   Post-op Problem    Ricky Cook is a 36 y.o. male.  HPI Patient is a 36 year old male who underwent an uncomplicated [id number] anterior cervical discectomy and fusion for treatment of compressive cervical myelopathy and radiculopathy.  Later the night of the surgery the patient developed neck swelling secondary to a hematoma and early airway compromise.  He was taken back to the OR and the wound was reexplored.  Hematoma was evacuated.  Patient was kept intubated overnight.  He was extubated the next day, observed, and was then discharged.  He was then seen yesterday in the [facility] emergency department and had a new work-up including a CT scan.  There was not a concern for airway compromise and patient was oxygenating at 98%.  He was discussed with our neurosurgery team and discharged in stable condition on a Medrol Dosepak.  Patient states that over the past 24 hours the swelling on his neck has continued to worsen.  He feels that it has nearly doubled in size.  His father at bedside confirms this.  Reports worsening pain as well as shortness of breath with flexion and extension of the neck.  Denies any fevers, chills, cough, chest pain.    Home Medications Prior to Admission medications   Medication Sig Start Date End Date Taking? Authorizing Provider  albuterol (VENTOLIN HFA) 108 (90 Base) MCG/ACT inhaler Inhale 2 puffs into the lungs every 6 (six) hours as needed for wheezing or shortness of breath.   Yes [provider]  amLODipine (NORVASC) 10 MG tablet Take 10 mg by mouth daily. 10/05/21  Yes [provider]  cetirizine (ZYRTEC) 10 MG tablet Take 10 mg by mouth daily.   Yes [provider]  cyclobenzaprine (FLEXERIL) 10 MG tablet Take 1 tablet (10 mg total) by mouth 3  (three) times daily as needed for muscle spasms. 12/23/21  Yes Pool, Mallie Mussel, MD  famotidine (PEPCID) 20 MG tablet Take 20 mg by mouth daily. 10/05/21  Yes [provider]  HYDROcodone-acetaminophen (NORCO/VICODIN) 5-325 MG tablet Take 1 tablet by mouth every 4 (four) hours as needed for moderate pain ((score 4 to 6)). 12/23/21  Yes Pool, Mallie Mussel, MD  losartan (COZAAR) 100 MG tablet Take 100 mg by mouth daily. 10/05/21  Yes [provider]  methylPREDNISolone (MEDROL DOSEPAK) 4 MG TBPK tablet Take 4 mg by mouth See admin instructions. 6,5,4,3,2,1 12/26/21  Yes [provider]  predniSONE (DELTASONE) 20 MG tablet Day 1-3: Take 3 tablets daily. Day 4-6: Take 2 tablets daily. Day 7-9: Take 1 tablet daily. Take tablets daily with breakfast. Patient not taking: Reported on 12/07/2021 10/15/21   Jaynee Eagles, PA-C  tiZANidine (ZANAFLEX) 4 MG tablet Take 1 tablet (4 mg total) by mouth at bedtime. Patient not taking: Reported on 12/08/2021 10/15/21   Jaynee Eagles, PA-C      Allergies    Ceftin [cefuroxime axetil], Augmentin [amoxicillin-pot clavulanate], and Sulfa antibiotics    Review of Systems   Review of Systems  All other systems reviewed and are negative. Ten systems reviewed and are negative for acute change, except as noted in the HPI.   Physical Exam Updated Vital Signs BP (!) 165/91 (BP Location: Right Arm)    Pulse 98    Temp 97.9 F (36.6 C) (Oral)  Resp 18    SpO2 100%  Physical Exam Vitals and nursing note reviewed.  Constitutional:      General: He is not in acute distress.    Appearance: Normal appearance. He is not ill-appearing, toxic-appearing or diaphoretic.  HENT:     Head: Normocephalic and atraumatic.     Right Ear: External ear normal.     Left Ear: External ear normal.     Nose: Nose normal.     Mouth/Throat:     Mouth: Mucous membranes are moist.     Pharynx: Oropharynx is clear. No oropharyngeal exudate or posterior oropharyngeal erythema.  Eyes:      General: No scleral icterus.       Right eye: No discharge.        Left eye: No discharge.     Extraocular Movements: Extraocular movements intact.     Conjunctiva/sclera: Conjunctivae normal.  Neck:     Comments: Moderate hematoma noted to the right anterior portion of the neck. Cardiovascular:     Rate and Rhythm: Normal rate and regular rhythm.     Pulses: Normal pulses.     Heart sounds: Normal heart sounds. No murmur heard.   No friction rub. No gallop.  Pulmonary:     Effort: Pulmonary effort is normal. No respiratory distress.     Breath sounds: Normal breath sounds. No stridor. No wheezing, rhonchi or rales.     Comments: Lungs are clear to auscultation bilaterally.  No wheezing, rales, or rhonchi.  Patient without stridor when keeping his head upright but with movement of the neck patient has a barking cough and evidence of stridor. Abdominal:     General: Abdomen is flat.     Tenderness: There is no abdominal tenderness.  Musculoskeletal:        General: Normal range of motion.     Cervical back: Normal range of motion and neck supple.  Skin:    General: Skin is warm and dry.  Neurological:     General: No focal deficit present.     Mental Status: He is alert and oriented to person, place, and time.  Psychiatric:        Mood and Affect: Mood normal.        Behavior: Behavior normal.   ED Results / Procedures / Treatments   Labs (all labs ordered are listed, but only abnormal results are displayed) Labs Reviewed  CBC WITH DIFFERENTIAL/PLATELET - Abnormal; Notable for the following components:      Result Value   WBC 12.5 (*)    Hemoglobin 12.9 (*)    Neutro Abs 8.7 (*)    All other components within normal limits  COMPREHENSIVE METABOLIC PANEL   EKG None  Radiology CT Soft Tissue Neck W Contrast  Result Date: 12/28/2021 CLINICAL DATA:  Initial evaluation for acute soft tissue swelling. EXAM: CT NECK WITH CONTRAST TECHNIQUE: Multidetector CT imaging of  the neck was performed using the standard protocol following the bolus administration of intravenous contrast. CONTRAST:  64mL OMNIPAQUE IOHEXOL 300 MG/ML  SOLN COMPARISON:  None. FINDINGS: Pharynx and larynx: Oral cavity within normal limits. Oropharynx and nasopharynx within normal limits. Postoperative changes from recent ACDF at C4-C6. Persistent retropharyngeal/prevertebral edema and swelling involving the right greater than left retropharyngeal space anterior to the discectomy site. Superimposed isodense collection measuring approximately 4.7 x 1.7 x 5.9 cm (transverse by AP by craniocaudad), likely hematoma given provided history. Mild mass effect on the supraglottic airway which is no more than  mildly narrowed but remains patent at this time. Soft tissue swelling and fullness extends along the operative approach at the right neck, likely blood products. Right sternocleidomastoid muscle is somewhat thickened. Postoperative stranding extends to involve the right submandibular space. No other convincing inflammatory changes to suggest acute infection at this time. Salivary glands: Salivary glands including the parotid and submandibular glands are within normal limits. Thyroid: Normal. Lymph nodes: No enlarged or pathologic adenopathy within the neck. Vascular: Normal intravascular enhancement seen throughout the neck. No visible active contrast extravasation. Limited intracranial: Unremarkable. Visualized orbits: Unremarkable. Mastoids and visualized paranasal sinuses: Visualized paranasal sinuses are largely clear. Visualized mastoids and middle ear cavities are well pneumatized and free of fluid. Skeleton: Recent ACDF at C4-C6. No visible hardware complication. No discrete or worrisome osseous lesions. Upper chest: 9 mm nodule present at the right upper lobe (series 4, image 122), indeterminate. Visualized upper chest demonstrates no other acute finding. Other: None. IMPRESSION: 1. Postoperative changes from  recent ACDF at C4-C6. Persistent retropharyngeal/prevertebral edema and swelling with probable superimposed 4.7 x 1.7 x 5.9 cm isodense collection, likely hematoma given provided history. Mild mass effect on the supraglottic airway which remains patent at this time. 2. No other acute abnormality within the neck. 3. 9 mm right upper lobe pulmonary nodule, indeterminate. Consider one of the following in 3 months for both low-risk and high-risk individuals: (a) repeat chest CT, (b) follow-up PET-CT, or (c) tissue sampling. This recommendation follows the consensus statement: Guidelines for Management of Incidental Pulmonary Nodules Detected on CT Images: From the Fleischner Society 2017; Radiology 2017; 284:228-243. Electronically Signed   By: Jeannine Boga M.D.   On: 12/28/2021 03:57    Procedures Procedures   Medications Ordered in ED Medications  dexamethasone (DECADRON) injection 10 mg (has no administration in time range)  iohexol (OMNIPAQUE) 300 MG/ML solution 75 mL (75 mLs Intravenous Contrast Given 12/28/21 0318)  morphine 2 MG/ML injection 2 mg (2 mg Intravenous Given 12/28/21 0430)   ED Course/ Medical Decision Making/ A&P                           Medical Decision Making Pt is a 36 y.o. male who presents to the emergency department with postoperative neck swelling as well as shortness of breath.  Labs: CBC with leukocytosis to 12.5, hemoglobin 12.9, neutrophils of 8.7. CMP is pending.  Imaging: CT soft tissue of the neck shows IMPRESSION: 1. Postoperative changes from recent ACDF at C4-C6. Persistent retropharyngeal/prevertebral edema and swelling with probable superimposed 4.7 x 1.7 x 5.9 cm isodense collection, likely hematoma given provided history. Mild mass effect on the supraglottic airway which remains patent at this time. 2. No other acute abnormality within the neck. 3. 9 mm right upper lobe pulmonary nodule, indeterminate. Consider one of the following in 3 months for  both low-risk and high-risk individuals: (a) repeat chest CT, (b) follow-up PET-CT, or (c) tissue sampling. This recommendation follows the consensus statement: Guidelines for Management of Incidental Pulmonary Nodules Detected on CT Images: From the Fleischner Society 2017; Radiology 2017; 284:228-243. Electronically Signed   By: Jeannine Boga M.D.   On: 12/28/2021 03:57    I, Rayna Sexton, PA-C, personally reviewed and evaluated these images and lab results as part of my medical decision-making.  Patient with a moderate hematoma to the right anterior portion of the neck.  When keeping the neck upright his airway appears patent.  He is handling secretions.  Oxygen  saturation is in the high 90s on room air.  With any movement of the neck patient has a barking cough and become stridorous.    Patient discussed with neurosurgery who agreed with admission.  They requested that we give patient 10 mg of IV Decadron.  This has been ordered.  Neurosurgery to admit.  Note: Portions of this report may have been transcribed using voice recognition software. Every effort was made to ensure accuracy; however, inadvertent computerized transcription errors may be present.   Final Clinical Impression(s) / ED Diagnoses Final diagnoses:  Complication of procedure, subsequent encounter  Neck swelling   Rx / DC Orders ED Discharge Orders     None         Rayna Sexton, PA-C 12/28/21 0435    Regan Lemming, MD 12/28/21 [phone]

## 2021-12-28 NOTE — Progress Notes (Signed)
Overall stable.  Patient continues to swallow well.  He is hungry and asking for a diet.  Exam reveals an organized hematoma in the superficial wound space.  Airway is midline.  No stridor.  No respiratory distress.  We will give clear liquid diet and continue to observe.  Plan for at least overnight observation.

## 2021-12-29 MED ORDER — METHYLPREDNISOLONE 4 MG PO TBPK: ORAL_TABLET | ORAL | 0 refills | Status: DC

## 2021-12-29 NOTE — Discharge Instructions (Signed)

## 2021-12-29 NOTE — TOC Progression Note (Signed)
Transition of Care [facility]) - Progression Note    Patient Details  Name: Ricky Cook MRN: [id number] Date of Birth: [birth year]  Transition of [facility] CM/SW Contact  Beckie Busing, RN Phone Number:[phone]  12/29/2021, 8:54 AM  Clinical Narrative:     Transition of Care (TOC) Screening Note   Patient Details  Name: Ricky Cook Date of Birth: [birth year]   Transition [facility]) CM/SW Contact:    Beckie Busing, RN Phone Number: 12/29/2021, 8:55 AM    Transition of Care Department (TOC) has reviewed patient and no TOC needs have been identified at this time. We will continue to monitor patient advancement through interdisciplinary progression rounds. If new patient transition needs arise, please place a TOC consult.       Barriers to Discharge: No Barriers Identified  Expected Discharge Plan and Services           Expected Discharge Date: 12/29/21                                     Social Determinants of Health (SDOH) Interventions    Readmission Risk Interventions No flowsheet data found.

## 2021-12-29 NOTE — Discharge Summary (Signed)
Physician Discharge Summary  Patient ID: Ricky Cook MRN: [id number] DOB/AGE: [birth year] 35 y.o.  Admit date: 12/28/2021 Discharge date: 12/29/2021  Admission Diagnoses:  Discharge Diagnoses:  Principal Problem:   Hematoma   Discharged Condition: good  Hospital Course: Patient readmitted to the hospital with concerns of airway swelling.  Patient recently status post two-level anterior cervical discectomy and fusion complicated by postoperative hematoma requiring surgical reexploration.  Patient presented with some soft tissue swelling and superficial hematoma.  CT scan demonstrated presence of the hematoma but no evidence of airway compromise.  Patient is breathing easily.  He is swallowing well.  The hematoma appears to be confined to the superficial wound compartment.  Patient feels comfortable going home.  Consults:   Significant Diagnostic Studies:   Treatments:   Discharge Exam: Blood pressure (!) 166/87, pulse (!) 110, temperature 98.1 F (36.7 C), temperature source Oral, resp. rate 14, weight 118 kg, SpO2 96 %. Awake and alert.  Oriented and appropriate.  Motor and sensory function intact.  Wound clean and dry.  There is a superficial organized Thoma beneath the skin.  His airway is midline.  He is breathing easily.  He is swallowing well.  Chest and abdomen are benign.  Extremities are free from injury or deformity.  Disposition: Discharge disposition: 01-Home or Self Care       Discharge Instructions     Incentive spirometry RT   Complete by: As directed       Allergies as of 12/29/2021       Reactions   Ceftin [cefuroxime Axetil] Shortness Of Breath   Augmentin [amoxicillin-pot Clavulanate] Other (See Comments)   thrush   Sulfa Antibiotics Other (See Comments)   Thrush        Medication List     TAKE these medications    albuterol 108 (90 Base) MCG/ACT inhaler Commonly known as: VENTOLIN HFA Inhale 2 puffs into the lungs every 6 (six) hours as  needed for wheezing or shortness of breath.   amLODipine 10 MG tablet Commonly known as: NORVASC Take 10 mg by mouth daily.   cetirizine 10 MG tablet Commonly known as: ZYRTEC Take 10 mg by mouth daily.   cyclobenzaprine 10 MG tablet Commonly known as: FLEXERIL Take 1 tablet (10 mg total) by mouth 3 (three) times daily as needed for muscle spasms.   famotidine 20 MG tablet Commonly known as: PEPCID Take 20 mg by mouth daily.   HYDROcodone-acetaminophen 5-325 MG tablet Commonly known as: NORCO/VICODIN Take 1 tablet by mouth every 4 (four) hours as needed for moderate pain ((score 4 to 6)).   losartan 100 MG tablet Commonly known as: COZAAR Take 100 mg by mouth daily.   methylPREDNISolone 4 MG Tbpk tablet Commonly known as: MEDROL DOSEPAK Take 4 mg by mouth See admin instructions. 6,5,4,3,2,1 What changed: Another medication with the same name was added. Make sure you understand how and when to take each.   methylPREDNISolone 4 MG Tbpk tablet Commonly known as: MEDROL DOSEPAK follow package directions What changed: You were already taking a medication with the same name, and this prescription was added. Make sure you understand how and when to take each.   predniSONE 20 MG tablet Commonly known as: DELTASONE Day 1-3: Take 3 tablets daily. Day 4-6: Take 2 tablets daily. Day 7-9: Take 1 tablet daily. Take tablets daily with breakfast.   tiZANidine 4 MG tablet Commonly known as: Zanaflex Take 1 tablet (4 mg total) by mouth at bedtime.  Signed: Kathaleen Maser Jacinta Penalver 12/29/2021, 8:22 AM

## 2022-01-07 ENCOUNTER — Other Ambulatory Visit (HOSPITAL_COMMUNITY): Payer: Self-pay | Admitting: Neurosurgery

## 2022-01-07 ENCOUNTER — Other Ambulatory Visit: Payer: Self-pay

## 2022-01-07 ENCOUNTER — Ambulatory Visit (HOSPITAL_COMMUNITY)
Admission: RE | Admit: 2022-01-07 | Discharge: 2022-01-07 | Disposition: A | Discharge status: Home / Self Care | Payer: Medicaid Other | Source: Ambulatory Visit | Attending: Neurosurgery | Admitting: Neurosurgery

## 2022-01-07 DIAGNOSIS — M7989 Other specified soft tissue disorders: Secondary | ICD-10-CM

## 2022-01-07 DIAGNOSIS — M79669 Pain in unspecified lower leg: Secondary | ICD-10-CM | POA: Diagnosis present

## 2022-01-07 NOTE — Progress Notes (Signed)
Lower extremity venous has been completed.   Preliminary results in CV Proc.   Aundra Millet Patryck Kilgore 01/07/2022 4:26 PM

## 2022-01-25 ENCOUNTER — Encounter (HOSPITAL_COMMUNITY): Payer: Self-pay | Admitting: Radiology

## 2022-05-24 ENCOUNTER — Ambulatory Visit (HOSPITAL_COMMUNITY): Payer: Medicaid Other | Admitting: Physical Therapy

## 2022-05-24 ENCOUNTER — Ambulatory Visit (HOSPITAL_COMMUNITY): Payer: Medicaid Other | Attending: Neurosurgery

## 2022-05-24 ENCOUNTER — Encounter (HOSPITAL_COMMUNITY): Payer: Self-pay

## 2022-05-24 DIAGNOSIS — M542 Cervicalgia: Secondary | ICD-10-CM | POA: Diagnosis present

## 2022-05-24 DIAGNOSIS — M5412 Radiculopathy, cervical region: Secondary | ICD-10-CM | POA: Diagnosis present

## 2022-05-24 DIAGNOSIS — R2689 Other abnormalities of gait and mobility: Secondary | ICD-10-CM | POA: Diagnosis present

## 2022-05-24 DIAGNOSIS — M5459 Other low back pain: Secondary | ICD-10-CM | POA: Diagnosis present
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
## 2022-06-01 ENCOUNTER — Ambulatory Visit (HOSPITAL_COMMUNITY): Payer: Medicaid Other | Attending: Internal Medicine | Admitting: Physical Therapy

## 2022-06-01 ENCOUNTER — Encounter (HOSPITAL_COMMUNITY): Payer: Self-pay | Admitting: Physical Therapy

## 2022-06-01 DIAGNOSIS — M542 Cervicalgia: Secondary | ICD-10-CM | POA: Diagnosis not present

## 2022-06-01 DIAGNOSIS — I10 Essential (primary) hypertension: Secondary | ICD-10-CM | POA: Diagnosis not present

## 2022-06-01 DIAGNOSIS — M5459 Other low back pain: Secondary | ICD-10-CM | POA: Diagnosis not present

## 2022-06-01 DIAGNOSIS — R2689 Other abnormalities of gait and mobility: Secondary | ICD-10-CM

## 2022-06-01 DIAGNOSIS — M5412 Radiculopathy, cervical region: Secondary | ICD-10-CM
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
## 2022-06-07 ENCOUNTER — Ambulatory Visit (HOSPITAL_COMMUNITY): Payer: Medicaid Other | Attending: Internal Medicine | Admitting: Physical Therapy

## 2022-06-07 DIAGNOSIS — M5459 Other low back pain: Secondary | ICD-10-CM

## 2022-06-07 DIAGNOSIS — M5412 Radiculopathy, cervical region: Secondary | ICD-10-CM

## 2022-06-07 DIAGNOSIS — R269 Unspecified abnormalities of gait and mobility: Secondary | ICD-10-CM | POA: Insufficient documentation

## 2022-06-07 DIAGNOSIS — R2689 Other abnormalities of gait and mobility: Secondary | ICD-10-CM

## 2022-06-07 DIAGNOSIS — M542 Cervicalgia: Secondary | ICD-10-CM

## 2022-06-07 DIAGNOSIS — M545 Low back pain, unspecified: Secondary | ICD-10-CM | POA: Insufficient documentation

## 2022-06-07 DIAGNOSIS — R262 Difficulty in walking, not elsewhere classified: Secondary | ICD-10-CM | POA: Diagnosis not present
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
## 2022-06-14 ENCOUNTER — Ambulatory Visit (HOSPITAL_COMMUNITY): Payer: Medicaid Other | Admitting: Physical Therapy

## 2022-06-14 DIAGNOSIS — M5459 Other low back pain: Secondary | ICD-10-CM | POA: Diagnosis not present

## 2022-06-14 DIAGNOSIS — M542 Cervicalgia: Secondary | ICD-10-CM

## 2022-06-14 DIAGNOSIS — R2689 Other abnormalities of gait and mobility: Secondary | ICD-10-CM

## 2022-06-14 DIAGNOSIS — M5412 Radiculopathy, cervical region: Secondary | ICD-10-CM

## 2022-06-16 ENCOUNTER — Ambulatory Visit (HOSPITAL_COMMUNITY): Payer: Medicaid Other | Admitting: Physical Therapy

## 2022-06-16 DIAGNOSIS — R2689 Other abnormalities of gait and mobility: Secondary | ICD-10-CM

## 2022-06-16 DIAGNOSIS — M5459 Other low back pain: Secondary | ICD-10-CM

## 2022-06-16 DIAGNOSIS — M5412 Radiculopathy, cervical region: Secondary | ICD-10-CM

## 2022-06-16 DIAGNOSIS — M542 Cervicalgia: Secondary | ICD-10-CM
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
## 2022-06-22 ENCOUNTER — Encounter (HOSPITAL_COMMUNITY): Payer: Self-pay

## 2022-06-22 ENCOUNTER — Ambulatory Visit (HOSPITAL_COMMUNITY): Payer: Medicaid Other | Attending: Neurosurgery

## 2022-06-22 DIAGNOSIS — M5459 Other low back pain: Secondary | ICD-10-CM | POA: Diagnosis not present

## 2022-06-22 DIAGNOSIS — R2689 Other abnormalities of gait and mobility: Secondary | ICD-10-CM | POA: Insufficient documentation

## 2022-06-22 DIAGNOSIS — M542 Cervicalgia: Secondary | ICD-10-CM | POA: Diagnosis present

## 2022-06-22 DIAGNOSIS — M5412 Radiculopathy, cervical region: Secondary | ICD-10-CM | POA: Diagnosis present
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
## 2022-06-24 ENCOUNTER — Ambulatory Visit (HOSPITAL_COMMUNITY): Payer: Medicaid Other | Admitting: Physical Therapy

## 2022-06-24 DIAGNOSIS — M5459 Other low back pain: Secondary | ICD-10-CM

## 2022-06-24 DIAGNOSIS — M542 Cervicalgia: Secondary | ICD-10-CM

## 2022-06-24 DIAGNOSIS — M5412 Radiculopathy, cervical region: Secondary | ICD-10-CM

## 2022-06-24 DIAGNOSIS — R2689 Other abnormalities of gait and mobility: Secondary | ICD-10-CM
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
## 2022-06-27 ENCOUNTER — Encounter (HOSPITAL_COMMUNITY): Payer: Medicaid Other

## 2022-06-29 ENCOUNTER — Encounter (HOSPITAL_COMMUNITY): Payer: Medicaid Other

## 2022-07-04 ENCOUNTER — Encounter (HOSPITAL_COMMUNITY): Payer: Medicaid Other | Admitting: Physical Therapy

## 2022-07-06 ENCOUNTER — Encounter (HOSPITAL_COMMUNITY): Payer: Medicaid Other

## 2022-08-18 ENCOUNTER — Ambulatory Visit (INDEPENDENT_AMBULATORY_CARE_PROVIDER_SITE_OTHER): Payer: Medicaid Other | Admitting: Orthopaedic Surgery

## 2022-08-18 ENCOUNTER — Encounter: Payer: Self-pay | Admitting: Orthopaedic Surgery

## 2022-08-18 VITALS — Ht 71.0 in | Wt 267.0 lb

## 2022-08-18 DIAGNOSIS — M25562 Pain in left knee: Secondary | ICD-10-CM | POA: Diagnosis not present
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
## 2022-08-31 ENCOUNTER — Telehealth: Payer: Self-pay | Admitting: Radiology

## 2022-08-31 DIAGNOSIS — M25562 Pain in left knee: Secondary | ICD-10-CM

## 2022-08-31 NOTE — Telephone Encounter (Signed)
Referral entered  

## 2022-08-31 NOTE — Addendum Note (Signed)
Addended by: Rogers Seeds on: 08/31/2022 03:05 PM   Modules accepted: Orders

## 2022-08-31 NOTE — Telephone Encounter (Signed)
Patient's MRI knee was denied due to no conservative treatment. He would like to be scheduled for therapy at [facility].

## 2022-09-06 ENCOUNTER — Telehealth: Payer: Self-pay | Admitting: Radiology

## 2022-09-06 NOTE — Telephone Encounter (Signed)
noted 

## 2022-09-06 NOTE — Telephone Encounter (Signed)
I spoke with patient. He is having increased pain in his knee and cannot be seen to start PT x 2 weeks. He asked about returning to see you because he could not get his MRI.  I advised that it was his insurance that denied the MRI and said that he needed conservative treatment. He wants to know if you would consider an injection or something to help until he can get to and complete PT.  Please advise.  CB [phone]

## 2022-09-06 NOTE — Telephone Encounter (Signed)
Received message from [place] office this morning.  Please call pt. He is concerned that he can't start PT for 2 more weeks and he is in a lot of pain   I called, left voicemail for patient requesting return call.

## 2022-09-07 ENCOUNTER — Encounter: Payer: Self-pay | Admitting: Physical Therapy

## 2022-09-07 ENCOUNTER — Ambulatory Visit: Payer: Medicaid Other | Admitting: Physical Therapy

## 2022-09-07 ENCOUNTER — Other Ambulatory Visit: Payer: Self-pay

## 2022-09-07 ENCOUNTER — Ambulatory Visit (INDEPENDENT_AMBULATORY_CARE_PROVIDER_SITE_OTHER): Payer: Medicaid Other | Admitting: Physical Therapy

## 2022-09-07 DIAGNOSIS — R2689 Other abnormalities of gait and mobility: Secondary | ICD-10-CM | POA: Diagnosis not present

## 2022-09-07 DIAGNOSIS — M6281 Muscle weakness (generalized): Secondary | ICD-10-CM | POA: Diagnosis not present

## 2022-09-07 DIAGNOSIS — M25562 Pain in left knee: Secondary | ICD-10-CM
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
## 2022-09-15 ENCOUNTER — Ambulatory Visit (INDEPENDENT_AMBULATORY_CARE_PROVIDER_SITE_OTHER): Payer: Medicaid Other | Admitting: Rehabilitative and Restorative Service Providers"

## 2022-09-15 ENCOUNTER — Encounter: Payer: Self-pay | Admitting: Rehabilitative and Restorative Service Providers"

## 2022-09-15 DIAGNOSIS — R2689 Other abnormalities of gait and mobility: Secondary | ICD-10-CM | POA: Diagnosis not present

## 2022-09-15 DIAGNOSIS — M6281 Muscle weakness (generalized): Secondary | ICD-10-CM
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
## 2022-09-21 ENCOUNTER — Encounter: Payer: Self-pay | Admitting: Rehabilitative and Restorative Service Providers"

## 2022-09-21 ENCOUNTER — Ambulatory Visit (INDEPENDENT_AMBULATORY_CARE_PROVIDER_SITE_OTHER): Payer: Medicaid Other | Admitting: Rehabilitative and Restorative Service Providers"

## 2022-09-21 DIAGNOSIS — R2689 Other abnormalities of gait and mobility: Secondary | ICD-10-CM | POA: Diagnosis not present

## 2022-09-21 DIAGNOSIS — M6281 Muscle weakness (generalized): Secondary | ICD-10-CM

## 2022-09-21 DIAGNOSIS — M25562 Pain in left knee: Secondary | ICD-10-CM
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
## 2022-09-28 ENCOUNTER — Encounter: Payer: Self-pay | Admitting: Physical Therapy

## 2022-09-28 ENCOUNTER — Ambulatory Visit (INDEPENDENT_AMBULATORY_CARE_PROVIDER_SITE_OTHER): Payer: Medicaid Other | Admitting: Physical Therapy

## 2022-09-28 DIAGNOSIS — M25562 Pain in left knee: Secondary | ICD-10-CM | POA: Diagnosis not present

## 2022-09-28 DIAGNOSIS — M6281 Muscle weakness (generalized): Secondary | ICD-10-CM | POA: Diagnosis not present

## 2022-09-28 DIAGNOSIS — R2689 Other abnormalities of gait and mobility: Secondary | ICD-10-CM | POA: Diagnosis not present
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
## 2022-09-30 ENCOUNTER — Telehealth: Payer: Self-pay | Admitting: Orthopaedic Surgery

## 2022-09-30 DIAGNOSIS — M25562 Pain in left knee: Secondary | ICD-10-CM

## 2022-09-30 NOTE — Telephone Encounter (Signed)
Patient called, returning a call to [facility].

## 2022-09-30 NOTE — Addendum Note (Signed)
Addended by: Meyer Cory on: 09/30/2022 10:34 AM   Modules accepted: Orders

## 2022-09-30 NOTE — Telephone Encounter (Signed)
Per Dr. [name], resubmit for MRI of left knee post PT.  I spoke with patient and advised. New order entered. [place] with [place] office will work on obtaining authorization and getting scan scheduled at [facility] as patient wants to go to whichever facility he can get in quickest.

## 2022-10-21 ENCOUNTER — Telehealth: Payer: Self-pay | Admitting: Orthopaedic Surgery

## 2022-10-21 NOTE — Telephone Encounter (Signed)
Pt called requesting Ricky Cook to call radiology for MRI results. Pt states he had MRI last week and no results showing on chart. Pt also asking fot [name] to call and set appt with him once they release MRI results so he can results read to him by Dr [name]. Pt has a new phone number [phone].

## 2022-10-21 NOTE — Telephone Encounter (Signed)
Holding for [employer].

## 2022-10-24 NOTE — Telephone Encounter (Signed)
I left voicemail for patient advising we can get him in to [place] office to schedule appt for review on Thursday or he could call back here and I would work him in to be seen this week in [place] office.

## 2022-10-27 ENCOUNTER — Encounter: Payer: Self-pay | Admitting: Orthopaedic Surgery

## 2022-10-27 ENCOUNTER — Ambulatory Visit (INDEPENDENT_AMBULATORY_CARE_PROVIDER_SITE_OTHER): Payer: Medicaid Other | Admitting: Orthopaedic Surgery

## 2022-10-27 VITALS — Ht 71.0 in | Wt 267.0 lb

## 2022-10-27 DIAGNOSIS — M25562 Pain in left knee: Secondary | ICD-10-CM | POA: Diagnosis not present
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
## 2022-11-08 ENCOUNTER — Encounter (HOSPITAL_BASED_OUTPATIENT_CLINIC_OR_DEPARTMENT_OTHER): Payer: Self-pay | Admitting: Orthopaedic Surgery

## 2022-11-08 ENCOUNTER — Other Ambulatory Visit: Payer: Self-pay

## 2022-11-14 ENCOUNTER — Ambulatory Visit (HOSPITAL_BASED_OUTPATIENT_CLINIC_OR_DEPARTMENT_OTHER): Payer: Medicaid Other | Admitting: Certified Registered"

## 2022-11-14 ENCOUNTER — Encounter (HOSPITAL_BASED_OUTPATIENT_CLINIC_OR_DEPARTMENT_OTHER)
Admission: RE | Disposition: A | Discharge status: Home / Self Care | Payer: Self-pay | Source: Home / Self Care | Attending: Orthopaedic Surgery

## 2022-11-14 ENCOUNTER — Other Ambulatory Visit: Payer: Self-pay

## 2022-11-14 ENCOUNTER — Ambulatory Visit (HOSPITAL_BASED_OUTPATIENT_CLINIC_OR_DEPARTMENT_OTHER)
Admission: RE | Admit: 2022-11-14 | Discharge: 2022-11-14 | Disposition: A | Discharge status: Home / Self Care | Payer: Medicaid Other | Attending: Orthopaedic Surgery | Admitting: Orthopaedic Surgery

## 2022-11-14 ENCOUNTER — Encounter (HOSPITAL_BASED_OUTPATIENT_CLINIC_OR_DEPARTMENT_OTHER): Payer: Self-pay | Admitting: Orthopaedic Surgery

## 2022-11-14 DIAGNOSIS — M25562 Pain in left knee: Secondary | ICD-10-CM | POA: Diagnosis present

## 2022-11-14 DIAGNOSIS — M659 Synovitis and tenosynovitis, unspecified: Secondary | ICD-10-CM | POA: Diagnosis not present

## 2022-11-14 DIAGNOSIS — M222X2 Patellofemoral disorders, left knee: Secondary | ICD-10-CM

## 2022-11-14 DIAGNOSIS — K219 Gastro-esophageal reflux disease without esophagitis: Secondary | ICD-10-CM | POA: Insufficient documentation

## 2022-11-14 DIAGNOSIS — Z79899 Other long term (current) drug therapy: Secondary | ICD-10-CM | POA: Diagnosis not present

## 2022-11-14 DIAGNOSIS — S8002XD Contusion of left knee, subsequent encounter: Secondary | ICD-10-CM

## 2022-11-14 DIAGNOSIS — M65162 Other infective (teno)synovitis, left knee: Secondary | ICD-10-CM | POA: Diagnosis not present

## 2022-11-14 DIAGNOSIS — S8002XA Contusion of left knee, initial encounter: Secondary | ICD-10-CM | POA: Insufficient documentation

## 2022-11-14 DIAGNOSIS — F17210 Nicotine dependence, cigarettes, uncomplicated: Secondary | ICD-10-CM | POA: Diagnosis not present

## 2022-11-14 DIAGNOSIS — I10 Essential (primary) hypertension: Secondary | ICD-10-CM | POA: Insufficient documentation

## 2022-11-14 DIAGNOSIS — Z01818 Encounter for other preprocedural examination: Secondary | ICD-10-CM

## 2022-11-14 HISTORY — PX: KNEE ARTHROSCOPY: SHX127

## 2022-11-14 SURGERY — ARTHROSCOPY, KNEE
Anesthesia: General | Site: Knee | Laterality: Left

## 2022-11-14 MED ORDER — FENTANYL CITRATE (PF) 100 MCG/2ML IJ SOLN
INTRAMUSCULAR | Status: AC
  Filled 2022-11-14: qty 2

## 2022-11-14 MED ORDER — MIDAZOLAM HCL 5 MG/5ML IJ SOLN
INTRAMUSCULAR | Status: DC | PRN
  Administered 2022-11-14: 2 mg via INTRAVENOUS

## 2022-11-14 MED ORDER — MIDAZOLAM HCL 2 MG/2ML IJ SOLN
INTRAMUSCULAR | Status: AC
  Filled 2022-11-14: qty 2

## 2022-11-14 MED ORDER — KETOROLAC TROMETHAMINE 30 MG/ML IJ SOLN
30.0000 mg | Freq: Once | INTRAMUSCULAR | Status: AC | PRN
  Administered 2022-11-14: 30 mg via INTRAVENOUS

## 2022-11-14 MED ORDER — VANCOMYCIN HCL 1500 MG/300ML IV SOLN
1500.0000 mg | INTRAVENOUS | Status: DC
  Filled 2022-11-14: qty 300

## 2022-11-14 MED ORDER — BUPIVACAINE-EPINEPHRINE (PF) 0.5% -1:200000 IJ SOLN
INTRAMUSCULAR | Status: AC
  Filled 2022-11-14: qty 30

## 2022-11-14 MED ORDER — SODIUM CHLORIDE 0.9 % IR SOLN
Status: DC | PRN
  Administered 2022-11-14: 6000 mL

## 2022-11-14 MED ORDER — FENTANYL CITRATE (PF) 100 MCG/2ML IJ SOLN
25.0000 ug | INTRAMUSCULAR | Status: DC | PRN
  Administered 2022-11-14 (×2): 50 ug via INTRAVENOUS

## 2022-11-14 MED ORDER — LIDOCAINE HCL (CARDIAC) PF 100 MG/5ML IV SOSY
PREFILLED_SYRINGE | INTRAVENOUS | Status: DC | PRN
  Administered 2022-11-14: 60 mg via INTRAVENOUS

## 2022-11-14 MED ORDER — ONDANSETRON HCL 4 MG/2ML IJ SOLN
INTRAMUSCULAR | Status: DC | PRN
  Administered 2022-11-14: 4 mg via INTRAVENOUS

## 2022-11-14 MED ORDER — FENTANYL CITRATE (PF) 100 MCG/2ML IJ SOLN
INTRAMUSCULAR | Status: DC | PRN
  Administered 2022-11-14: 100 ug via INTRAVENOUS
  Administered 2022-11-14: 25 ug via INTRAVENOUS

## 2022-11-14 MED ORDER — OXYCODONE-ACETAMINOPHEN 5-325 MG PO TABS: 1.0000 | ORAL_TABLET | Freq: Four times a day (QID) | ORAL | 0 refills | Status: DC | PRN

## 2022-11-14 MED ORDER — KETOROLAC TROMETHAMINE 30 MG/ML IJ SOLN
INTRAMUSCULAR | Status: AC
  Filled 2022-11-14: qty 1

## 2022-11-14 MED ORDER — ONDANSETRON HCL 4 MG/2ML IJ SOLN: 4.0000 mg | Freq: Once | INTRAMUSCULAR | Status: DC | PRN

## 2022-11-14 MED ORDER — DEXAMETHASONE SODIUM PHOSPHATE 10 MG/ML IJ SOLN
INTRAMUSCULAR | Status: DC | PRN
  Administered 2022-11-14: 5 mg via INTRAVENOUS

## 2022-11-14 MED ORDER — LACTATED RINGERS IV SOLN: INTRAVENOUS | Status: DC

## 2022-11-14 MED ORDER — PROPOFOL 10 MG/ML IV BOLUS
INTRAVENOUS | Status: DC | PRN
  Administered 2022-11-14: 200 mg via INTRAVENOUS

## 2022-11-14 MED ORDER — VANCOMYCIN HCL IN DEXTROSE 1-5 GM/200ML-% IV SOLN
INTRAVENOUS | Status: AC
  Filled 2022-11-14: qty 200

## 2022-11-14 MED ORDER — BUPIVACAINE HCL (PF) 0.25 % IJ SOLN
INTRAMUSCULAR | Status: AC
  Filled 2022-11-14: qty 30

## 2022-11-14 MED ORDER — PROPOFOL 500 MG/50ML IV EMUL
INTRAVENOUS | Status: DC | PRN
  Administered 2022-11-14: 25 ug/kg/min via INTRAVENOUS

## 2022-11-14 MED ORDER — OXYCODONE HCL 5 MG/5ML PO SOLN: 5.0000 mg | Freq: Once | ORAL | Status: DC | PRN

## 2022-11-14 MED ORDER — BUPIVACAINE-EPINEPHRINE (PF) 0.5% -1:200000 IJ SOLN
INTRAMUSCULAR | Status: DC | PRN
  Administered 2022-11-14: 30 mL

## 2022-11-14 MED ORDER — BUPIVACAINE-EPINEPHRINE (PF) 0.25% -1:200000 IJ SOLN
INTRAMUSCULAR | Status: AC
  Filled 2022-11-14: qty 30

## 2022-11-14 MED ORDER — OXYCODONE HCL 5 MG PO TABS: 5.0000 mg | ORAL_TABLET | Freq: Once | ORAL | Status: DC | PRN

## 2022-11-14 MED ORDER — VANCOMYCIN HCL 500 MG/100ML IV SOLN
INTRAVENOUS | Status: AC
  Filled 2022-11-14: qty 100

## 2022-11-14 SURGICAL SUPPLY — 30 items
APL SKNCLS STERI-STRIP NONHPOA (GAUZE/BANDAGES/DRESSINGS) ×1
BENZOIN TINCTURE PRP APPL 2/3 (GAUZE/BANDAGES/DRESSINGS) ×1 IMPLANT
BLADE EXCALIBUR 4.0X13 (MISCELLANEOUS) IMPLANT
BNDG ELASTIC 6X5.8 VLCR STR LF (GAUZE/BANDAGES/DRESSINGS) ×2 IMPLANT
DRAPE ARTHROSCOPY W/POUCH 90 (DRAPES) ×1 IMPLANT
DRSG TEGADERM 4X4.75 (GAUZE/BANDAGES/DRESSINGS) IMPLANT
DURAPREP 26ML APPLICATOR (WOUND CARE) ×1 IMPLANT
ELECT MENISCUS 165MM 90D (ELECTRODE) IMPLANT
ELECT REM PT RETURN 9FT ADLT (ELECTROSURGICAL)
ELECTRODE REM PT RTRN 9FT ADLT (ELECTROSURGICAL) IMPLANT
GAUZE SPONGE 4X4 12PLY STRL (GAUZE/BANDAGES/DRESSINGS) ×1 IMPLANT
GLOVE BIO SURGEON STRL SZ7.5 (GLOVE) ×1 IMPLANT
GLOVE BIOGEL PI IND STRL 8 (GLOVE) ×1 IMPLANT
GOWN STRL REUS W/ TWL LRG LVL3 (GOWN DISPOSABLE) ×1 IMPLANT
GOWN STRL REUS W/ TWL XL LVL3 (GOWN DISPOSABLE) ×1 IMPLANT
GOWN STRL REUS W/TWL LRG LVL3 (GOWN DISPOSABLE) ×1
GOWN STRL REUS W/TWL XL LVL3 (GOWN DISPOSABLE) ×1
HOLDER KNEE FOAM BLUE (MISCELLANEOUS) ×1 IMPLANT
MANIFOLD NEPTUNE II (INSTRUMENTS) IMPLANT
PACK ARTHROSCOPY DSU (CUSTOM PROCEDURE TRAY) ×1 IMPLANT
PACK BASIN DAY SURGERY FS (CUSTOM PROCEDURE TRAY) ×1 IMPLANT
PAD CAST 4YDX4 CTTN HI CHSV (CAST SUPPLIES) ×1 IMPLANT
PADDING CAST COTTON 4X4 STRL (CAST SUPPLIES) ×1
PADDING CAST COTTON 6X4 STRL (CAST SUPPLIES) IMPLANT
PENCIL SMOKE EVACUATOR (MISCELLANEOUS) IMPLANT
STRIP CLOSURE SKIN 1/4X4 (GAUZE/BANDAGES/DRESSINGS) ×1 IMPLANT
TOWEL GREEN STERILE FF (TOWEL DISPOSABLE) ×1 IMPLANT
TUBING ARTHROSCOPY IRRIG 16FT (MISCELLANEOUS) ×1 IMPLANT
WATER STERILE IRR 1000ML POUR (IV SOLUTION) ×1 IMPLANT
WRAP KNEE MAXI GEL POST OP (GAUZE/BANDAGES/DRESSINGS) IMPLANT
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
## 2022-11-14 NOTE — Anesthesia Procedure Notes (Signed)
Procedure Name: LMA Insertion Date/Time: 11/14/2022 7:33 AM  Performed by: Ariel Dimitri, Jewel Baize, CRNAPre-anesthesia Checklist: Patient identified, Emergency Drugs available, Suction available and Patient being monitored Patient Re-evaluated:Patient Re-evaluated prior to induction Oxygen Delivery Method: Circle system utilized Preoxygenation: Pre-oxygenation with 100% oxygen Induction Type: IV induction Ventilation: Mask ventilation without difficulty LMA: LMA inserted LMA Size: 4.0 Number of attempts: 1 Airway Equipment and Method: Bite block Placement Confirmation: positive ETCO2 Tube secured with: Tape Dental Injury: Teeth and Oropharynx as per pre-operative assessment

## 2022-11-14 NOTE — Discharge Instructions (Addendum)
Keep your knee elevated above your heart is much as she can the first few days after surgery to decrease swelling and decrease pain.  You can use crutches as needed.  24 to 48 hours after surgery can unwrap your knee leave the Tegaderm which looks like Saran wrap small squares that are about inch and a half on your knee.  Underneath her Steri-Strips do not remove them and you can shower dry off your knee with a towel and then reapply one of the 2 Ace wrap such after your dressing.  In addition to pain medication you can use either ibuprofen or Aleve as long as it does not bother her stomach and take with food.   Post Anesthesia Home Care Instructions  Activity: Get plenty of rest for the remainder of the day. A responsible individual must stay with you for 24 hours following the procedure.  For the next 24 hours, DO NOT: -Drive a car -Advertising copywriter -Drink alcoholic beverages -Take any medication unless instructed by your physician -Make any legal decisions or sign important papers.  Meals: Start with liquid foods such as gelatin or soup. Progress to regular foods as tolerated. Avoid greasy, spicy, heavy foods. If nausea and/or vomiting occur, drink only clear liquids until the nausea and/or vomiting subsides. Call your physician if vomiting continues.  Special Instructions/Symptoms: Your throat may feel dry or sore from the anesthesia or the breathing tube placed in your throat during surgery. If this causes discomfort, gargle with warm salt water. The discomfort should disappear within 24 hours.
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
## 2022-11-14 NOTE — Anesthesia Preprocedure Evaluation (Signed)
Anesthesia Evaluation  Patient identified by MRN, date of birth, ID band Patient awake    Reviewed: Allergy & Precautions, H&P , NPO status , Patient's Chart, lab work & pertinent test results  Airway Mallampati: II  TM Distance: >3 FB Neck ROM: Full    Dental no notable dental hx.    Pulmonary neg pulmonary ROS, Current Smoker and Patient abstained from smoking.   Pulmonary exam normal breath sounds clear to auscultation       Cardiovascular hypertension, Pt. on medications Normal cardiovascular exam Rhythm:Regular Rate:Normal     Neuro/Psych negative neurological ROS  negative psych ROS   GI/Hepatic Neg liver ROS,GERD  Medicated,,  Endo/Other  negative endocrine ROS    Renal/GU negative Renal ROS  negative genitourinary   Musculoskeletal negative musculoskeletal ROS (+)    Abdominal   Peds negative pediatric ROS (+)  Hematology negative hematology ROS (+)   Anesthesia Other Findings   Reproductive/Obstetrics negative OB ROS                             Anesthesia Physical Anesthesia Plan  ASA: 2  Anesthesia Plan: General   Post-op Pain Management: Minimal or no pain anticipated   Induction: Intravenous  PONV Risk Score and Plan: 1 and Ondansetron, Dexamethasone and Treatment may vary due to age or medical condition  Airway Management Planned: LMA  Additional Equipment:   Intra-op Plan:   Post-operative Plan: Extubation in OR  Informed Consent: I have reviewed the patients History and Physical, chart, labs and discussed the procedure including the risks, benefits and alternatives for the proposed anesthesia with the patient or authorized representative who has indicated his/her understanding and acceptance.     Dental advisory given  Plan Discussed with: CRNA and Surgeon  Anesthesia Plan Comments:        Anesthesia Quick Evaluation

## 2022-11-14 NOTE — Anesthesia Postprocedure Evaluation (Signed)
Anesthesia Post Note  Patient: Ricky Cook  Procedure(s) Performed: LEFT KNEE ARTHROSCOPY, PARTIAL SYNOVECTOMY (Left: Knee)     Patient location during evaluation: PACU Anesthesia Type: General Level of consciousness: awake and alert Pain management: pain level controlled Vital Signs Assessment: post-procedure vital signs reviewed and stable Respiratory status: spontaneous breathing, nonlabored ventilation and respiratory function stable Cardiovascular status: blood pressure returned to baseline and stable Postop Assessment: no apparent nausea or vomiting Anesthetic complications: no  No notable events documented.  Last Vitals:  Vitals:   11/14/22 0900 11/14/22 0915  BP: 136/76 126/76  Pulse: 65 62  Resp: 11 13  Temp:    SpO2: 99% 96%    Last Pain:  Vitals:   11/14/22 0915  TempSrc:   PainSc: 5                  [name],W. EDMOND

## 2022-11-14 NOTE — Transfer of Care (Signed)
Immediate Anesthesia Transfer of Care Note  Patient: Ricky Cook  Procedure(s) Performed: LEFT KNEE ARTHROSCOPY, PARTIAL SYNOVECTOMY (Left: Knee)  Patient Location: PACU  Anesthesia Type:General  Level of Consciousness: drowsy and patient cooperative  Airway & Oxygen Therapy: Patient Spontanous Breathing and Patient connected to face mask oxygen  Post-op Assessment: Report given to RN and Post -op Vital signs reviewed and stable  Post vital signs: Reviewed and stable  Last Vitals:  Vitals Value Taken Time  BP    Temp    Pulse 78 11/14/22 0812  Resp 11 11/14/22 0812  SpO2 98 % 11/14/22 0812  Vitals shown include unvalidated device data.  Last Pain:  Vitals:   11/14/22 9622  TempSrc: Oral  PainSc: 7          Complications: No notable events documented.

## 2022-11-14 NOTE — Interval H&P Note (Signed)
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  The various methods of treatment have been discussed with the patient and family. After consideration of risks, benefits and other options for treatment, the patient has consented to  Procedure(s): LEFT KNEE ARTHROSCOPY, PARTIAL SYNOVECTOMY (Left) as a surgical intervention.  The patient's history has been reviewed, patient examined, no change in status, stable for surgery.  I have reviewed the patient's chart and labs.  Questions were answered to the patient's satisfaction.     Eldred Manges

## 2022-11-15 ENCOUNTER — Encounter (HOSPITAL_BASED_OUTPATIENT_CLINIC_OR_DEPARTMENT_OTHER): Payer: Self-pay | Admitting: Orthopaedic Surgery

## 2022-11-15 NOTE — Progress Notes (Signed)
Left message stating courtesy call and if any questions or concerns please call the doctors office.  

## 2022-11-21 ENCOUNTER — Telehealth: Payer: Self-pay | Admitting: Orthopaedic Surgery

## 2022-11-21 ENCOUNTER — Other Ambulatory Visit: Payer: Self-pay | Admitting: Orthopaedic Surgery

## 2022-11-21 MED ORDER — OXYCODONE-ACETAMINOPHEN 5-325 MG PO TABS: 1.0000 | ORAL_TABLET | Freq: Four times a day (QID) | ORAL | 0 refills | Status: AC | PRN

## 2022-11-21 NOTE — Telephone Encounter (Signed)
I called patient and advised. 

## 2022-11-21 NOTE — Progress Notes (Signed)
I sent in percocet refill

## 2022-11-21 NOTE — Telephone Encounter (Signed)
Patient need a refill on his Oxycodone he is out.please advise..[phone]

## 2022-11-24 ENCOUNTER — Ambulatory Visit (INDEPENDENT_AMBULATORY_CARE_PROVIDER_SITE_OTHER): Payer: Medicaid Other | Admitting: Orthopaedic Surgery

## 2022-11-24 ENCOUNTER — Encounter: Payer: Self-pay | Admitting: Orthopaedic Surgery

## 2022-11-24 VITALS — Ht 71.0 in | Wt 266.0 lb

## 2022-11-24 DIAGNOSIS — M25562 Pain in left knee: Secondary | ICD-10-CM

## 2022-11-24 NOTE — Progress Notes (Signed)
   Post-Op Visit Note   Patient: Ricky Cook           Date of Birth: [birth year]           MRN: [id number] Visit Date: 11/24/2022 PCP: Toma Deiters, MD   Assessment & Plan: Postop synovectomy for lateral patellar tendon femoral condyle friction syndrome.  Partial synovectomy performed.  He is using a cane.  Steri-Strips changed.  He states he had rash in the private area no other areas.  Not had any contact that would put him at risk for STD.  Were still given no work x 5 weeks.  Recheck 5 weeks.  He will stop his Percocet.  Chief Complaint:  Chief Complaint  Patient presents with   Left Knee - Routine Post Op    11/14/2022 left knee arthroscopy, partial synovectomy   Visit Diagnoses: No diagnosis found.  Plan: ROV 5 wks  Follow-Up Instructions: No follow-ups on file.   Orders:  No orders of the defined types were placed in this encounter.  No orders of the defined types were placed in this encounter.   Imaging: No results found.  PMFS History: Patient Active Problem List   Diagnosis Date Noted   Left anterior knee pain 10/27/2022   Hematoma 12/28/2021   Surgery, elective 12/22/2021   Respiratory failure (HCC)    Hematoma of neck    Incidental finding of COVID-19 virus infection    Cervical spondylosis with myelopathy and radiculopathy 12/21/2021   Past Medical History:  Diagnosis Date   Asthma    GERD (gastroesophageal reflux disease)    Hypertension     Family History  Problem Relation Age of Onset   Hypertension Mother    Diabetes Mother    Hypercholesterolemia Father     Past Surgical History:  Procedure Laterality Date   ANTERIOR CERVICAL DECOMP/DISCECTOMY FUSION N/A 12/21/2021   Procedure: ANTERIOR CERVICAL WOUND EXPLORATION WITH EVACUATION OF HEMATOMA;  Surgeon: Lisbeth Renshaw, MD;  Location: [facility] OR;  Service: Neurosurgery;  Laterality: N/A;   ANTERIOR CERVICAL DECOMP/DISCECTOMY FUSION N/A 12/21/2021   Procedure: Anterior Cervical Decompression  Fusion  - Cervical four-Cervical five - Cervical five-Cervical six;  Surgeon: Julio Sicks, MD;  Location: [facility] OR;  Service: Neurosurgery;  Laterality: N/A;   KNEE ARTHROSCOPY Left 11/14/2022   Procedure: LEFT KNEE ARTHROSCOPY, PARTIAL SYNOVECTOMY;  Surgeon: Eldred Manges, MD;  Location: [facility];  Service: Orthopedics;  Laterality: Left;   TONSILLECTOMY     Social History   Occupational History   Not on file  Tobacco Use   Smoking status: Some Days    Packs/day: 0.25    Types: Cigarettes   Smokeless tobacco: Never  Vaping Use   Vaping Use: Never used  Substance and Sexual Activity   Alcohol use: Never   Drug use: Not Currently   Sexual activity: Not on file

## 2023-01-02 ENCOUNTER — Other Ambulatory Visit: Payer: Self-pay

## 2023-01-02 ENCOUNTER — Ambulatory Visit: Payer: Medicaid Other | Attending: Student | Admitting: Physical Therapy

## 2023-01-02 DIAGNOSIS — M5412 Radiculopathy, cervical region: Secondary | ICD-10-CM | POA: Insufficient documentation

## 2023-01-02 DIAGNOSIS — M6281 Muscle weakness (generalized): Secondary | ICD-10-CM | POA: Diagnosis present

## 2023-01-02 DIAGNOSIS — M542 Cervicalgia: Secondary | ICD-10-CM | POA: Diagnosis present
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
## 2023-01-09 ENCOUNTER — Ambulatory Visit: Payer: Medicaid Other | Admitting: Physical Therapy

## 2023-01-09 ENCOUNTER — Encounter: Payer: Self-pay | Admitting: Physical Therapy

## 2023-01-09 DIAGNOSIS — M542 Cervicalgia: Secondary | ICD-10-CM

## 2023-01-09 DIAGNOSIS — M5412 Radiculopathy, cervical region: Secondary | ICD-10-CM

## 2023-01-09 DIAGNOSIS — M6281 Muscle weakness (generalized): Secondary | ICD-10-CM

## 2023-01-16 ENCOUNTER — Encounter: Payer: Self-pay | Admitting: Physical Therapy

## 2023-01-16 ENCOUNTER — Ambulatory Visit: Payer: Medicaid Other | Admitting: Physical Therapy

## 2023-01-16 DIAGNOSIS — M542 Cervicalgia: Secondary | ICD-10-CM

## 2023-01-16 DIAGNOSIS — M5412 Radiculopathy, cervical region: Secondary | ICD-10-CM

## 2023-01-16 DIAGNOSIS — M6281 Muscle weakness (generalized): Secondary | ICD-10-CM | POA: Diagnosis not present
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
## 2023-01-19 ENCOUNTER — Encounter: Payer: Self-pay | Admitting: Orthopaedic Surgery

## 2023-01-19 ENCOUNTER — Ambulatory Visit (INDEPENDENT_AMBULATORY_CARE_PROVIDER_SITE_OTHER): Payer: Medicaid Other | Admitting: Orthopaedic Surgery

## 2023-01-19 ENCOUNTER — Ambulatory Visit: Payer: Self-pay

## 2023-01-19 VITALS — Ht 71.0 in | Wt 266.0 lb

## 2023-01-19 DIAGNOSIS — M7062 Trochanteric bursitis, left hip: Secondary | ICD-10-CM

## 2023-01-19 DIAGNOSIS — M25552 Pain in left hip: Secondary | ICD-10-CM

## 2023-01-19 NOTE — Progress Notes (Signed)
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  He states his knee is doing great but he is having this burning pain and pain over the left trochanter.  He states he has been told by Dr. Annette Stable that he needs more neck surgery for an additional level.  He has had previous cervical fusion in the past.  Patient is off his cane has been amatory points over the trochanter with having significant discomfort and pain.  He was placed on some prednisone for neck problems but states he still having pain in his neck and also over his left greater trochanter.  Trochanteric injection performed 3 cc.  Hopefully this will give him good relief.  He can follow-up in 5 weeks.  Chief Complaint:  Chief Complaint  Patient presents with   Left Knee - Follow-up, Routine Post Op     11/14/2022 left knee arthroscopy, partial synovectomy     Visit Diagnoses:  1. Pain in left hip   2. Trochanteric bursitis, left hip     Plan: Direct injection performed.  Recheck 5 weeks.  He is happy with his results of the knee arthroscopy for debridement.  Follow-Up Instructions: No follow-ups on file.   Orders:  Orders Placed This Encounter  Procedures   XR HIP UNILAT W OR W/O PELVIS 2-3 VIEWS LEFT   No orders of the defined types were placed in this encounter.   Imaging: No results found.  PMFS History: Patient Active Problem List   Diagnosis Date Noted   Trochanteric bursitis, left hip 01/19/2023   Left anterior knee pain 10/27/2022   Hematoma 12/28/2021   Surgery, elective 12/22/2021   Respiratory failure ([place])    Hematoma of neck    Incidental finding of COVID-19 virus infection    Cervical spondylosis with myelopathy and radiculopathy 12/21/2021   Past Medical History:  Diagnosis Date    Asthma    GERD (gastroesophageal reflux disease)    Hypertension     Family History  Problem Relation Age of Onset   Hypertension Mother    Diabetes Mother    Hypercholesterolemia Father     Past Surgical History:  Procedure Laterality Date   ANTERIOR CERVICAL DECOMP/DISCECTOMY FUSION N/A 12/21/2021   Procedure: ANTERIOR CERVICAL WOUND EXPLORATION WITH EVACUATION OF HEMATOMA;  Surgeon: Consuella Lose, MD;  Location: [place];  Service: Neurosurgery;  Laterality: N/A;   ANTERIOR CERVICAL DECOMP/DISCECTOMY FUSION N/A 12/21/2021   Procedure: Anterior Cervical Decompression Fusion  - Cervical four-Cervical five - Cervical five-Cervical six;  Surgeon: Earnie Larsson, MD;  Location: [place];  Service: Neurosurgery;  Laterality: N/A;   KNEE ARTHROSCOPY Left 11/14/2022   Procedure: LEFT KNEE ARTHROSCOPY, PARTIAL SYNOVECTOMY;  Surgeon: Marybelle Killings, MD;  Location: [place];  Service: Orthopedics;  Laterality: Left;   TONSILLECTOMY     Social History   Occupational History   Not on file  Tobacco Use   Smoking status: Some Days    Packs/day: 0.25    Types: Cigarettes   Smokeless tobacco: Never  Vaping Use   Vaping Use: Never used  Substance and Sexual Activity   Alcohol use: Never   Drug use: Not Currently   Sexual activity: Not on  file

## 2023-01-23 ENCOUNTER — Ambulatory Visit: Payer: Medicaid Other | Admitting: Physical Therapy

## 2023-01-23 ENCOUNTER — Ambulatory Visit: Payer: Medicaid Other | Attending: Student

## 2023-01-23 DIAGNOSIS — R2689 Other abnormalities of gait and mobility: Secondary | ICD-10-CM | POA: Insufficient documentation

## 2023-01-23 DIAGNOSIS — M6281 Muscle weakness (generalized): Secondary | ICD-10-CM | POA: Diagnosis present

## 2023-01-23 DIAGNOSIS — M542 Cervicalgia: Secondary | ICD-10-CM | POA: Insufficient documentation

## 2023-01-23 DIAGNOSIS — M5412 Radiculopathy, cervical region: Secondary | ICD-10-CM | POA: Diagnosis present
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
## 2023-01-26 ENCOUNTER — Encounter: Payer: Medicaid Other | Admitting: Orthopaedic Surgery

## 2023-01-30 ENCOUNTER — Ambulatory Visit: Payer: Medicaid Other

## 2023-01-30 DIAGNOSIS — M5412 Radiculopathy, cervical region: Secondary | ICD-10-CM

## 2023-01-30 DIAGNOSIS — M6281 Muscle weakness (generalized): Secondary | ICD-10-CM | POA: Diagnosis not present

## 2023-01-30 DIAGNOSIS — M542 Cervicalgia: Secondary | ICD-10-CM
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
## 2023-02-06 ENCOUNTER — Other Ambulatory Visit (HOSPITAL_COMMUNITY): Payer: Self-pay | Admitting: Student

## 2023-02-06 DIAGNOSIS — M5412 Radiculopathy, cervical region: Secondary | ICD-10-CM

## 2023-02-07 ENCOUNTER — Ambulatory Visit (HOSPITAL_COMMUNITY)
Admission: RE | Admit: 2023-02-07 | Discharge: 2023-02-07 | Disposition: A | Discharge status: Home / Self Care | Payer: Medicaid Other | Source: Ambulatory Visit | Attending: Student | Admitting: Student

## 2023-02-07 DIAGNOSIS — M5412 Radiculopathy, cervical region: Secondary | ICD-10-CM | POA: Diagnosis not present

## 2023-02-13 ENCOUNTER — Other Ambulatory Visit: Payer: Self-pay | Admitting: Neurosurgery

## 2023-02-21 NOTE — Progress Notes (Signed)
Surgical Instructions    Your procedure is scheduled on Tuesday March 12th.  Report to [facility] Main Entrance "A" at 9 A.M., then check in with the Admitting office.  Call this number if you have problems the morning of surgery:  [phone]   If you have any questions prior to your surgery date call [phone]: Open Monday-Friday 8am-4pm If you experience any cold or flu symptoms such as cough, fever, chills, shortness of breath, etc. between now and your scheduled surgery, please notify us at the above number     Remember:  Do not eat or drink after midnight the night before your surgery      Take these medicines the morning of surgery with A SIP OF WATER: amLODipine (NORVASC) 10 MG tablet  cetirizine (ZYRTEC) 10 MG tablet  famotidine (PEPCID) 20 MG tablet  gabapentin (NEURONTIN) 100 MG capsule  methylPREDNISolone (MEDROL DOSEPAK) 4 MG TBPK tablet    IF NEEDED  albuterol (VENTOLIN HFA) 108 (90 Base) MCG/ACT inhaler - please bring with you to the hospital cyclobenzaprine (FLEXERIL) 10 MG tablet  oxyCODONE (OXY IR/ROXICODONE) 5 MG immediate release tablet     As of today, STOP taking any Aspirin (unless otherwise instructed by your surgeon) Aleve, Naproxen, Ibuprofen, Motrin, Advil, Goody's, BC's, all herbal medications, fish oil, and all vitamins.           Do not wear jewelry  Do not wear lotions, powders, cologne or deodorant. Do not shave 48 hours prior to surgery.  Men may shave face and neck. Do not bring valuables to the hospital. Do not wear nail polish  [facility] is not responsible for any belongings or valuables.    Do NOT Smoke (Tobacco/Vaping)  24 hours prior to your procedure  If you use a CPAP at night, you may bring your mask for your overnight stay.   Contacts, glasses, hearing aids, dentures or partials may not be worn into surgery, please bring cases for these belongings   For patients admitted to the hospital, discharge time will be  determined by your treatment team.   Patients discharged the day of surgery will not be allowed to drive home, and someone needs to stay with them for 24 hours.   SURGICAL WAITING ROOM VISITATION Patients having surgery or a procedure may have no more than 2 support people in the waiting area - these visitors may rotate.   Children under the age of 31 must have an adult with them who is not the patient. If the patient needs to stay at the hospital during part of their recovery, the visitor guidelines for inpatient rooms apply. Pre-op nurse will coordinate an appropriate time for 1 support person to accompany patient in pre-op.  This support person may not rotate.   Please refer to [email or web address] for the visitor guidelines for Inpatients (after your surgery is over and you are in a regular room).    Special instructions:    Oral Hygiene is also important to reduce your risk of infection.  Remember - BRUSH YOUR TEETH THE MORNING OF SURGERY WITH YOUR REGULAR TOOTHPASTE   [facility]- Preparing For Surgery  Before surgery, you can play an important role. Because skin is not sterile, your skin needs to be as free of germs as possible. You can reduce the number of germs on your skin by washing with CHG (chlorahexidine gluconate) Soap before surgery.  CHG is an antiseptic cleaner which kills germs and bonds with the skin to continue killing germs  even after washing.     Please do not use if you have an allergy to CHG or antibacterial soaps. If your skin becomes reddened/irritated stop using the CHG.  Do not shave (including legs and underarms) for at least 48 hours prior to first CHG shower. It is OK to shave your face.  Please follow these instructions carefully.     Shower the NIGHT BEFORE SURGERY and the MORNING OF SURGERY with CHG Soap.   If you chose to wash your hair, wash your hair first as usual with your normal shampoo. After you  shampoo, rinse your hair and body thoroughly to remove the shampoo.  Then [employer] and genitals (private parts) with your normal soap and rinse thoroughly to remove soap.  After that Use CHG Soap as you would any other liquid soap. You can apply CHG directly to the skin and wash gently with a scrungie or a clean washcloth.   Apply the CHG Soap to your body ONLY FROM THE NECK DOWN.  Do not use on open wounds or open sores. Avoid contact with your eyes, ears, mouth and genitals (private parts). Wash Face and genitals (private parts)  with your normal soap.   Wash thoroughly, paying special attention to the area where your surgery will be performed.  Thoroughly rinse your body with warm water from the neck down.  DO NOT shower/wash with your normal soap after using and rinsing off the CHG Soap.  Pat yourself dry with a CLEAN TOWEL.  Wear CLEAN PAJAMAS to bed the night before surgery  Place CLEAN SHEETS on your bed the night before your surgery  DO NOT SLEEP WITH PETS.   Day of Surgery:  Take a shower with CHG soap. Wear Clean/Comfortable clothing the morning of surgery Do not apply any deodorants/lotions.   Remember to brush your teeth WITH YOUR REGULAR TOOTHPASTE.    If you received a COVID test during your pre-op visit, it is requested that you wear a mask when out in public, stay away from anyone that may not be feeling well, and notify your surgeon if you develop symptoms. If you have been in contact with anyone that has tested positive in the last 10 days, please notify your surgeon.    Please read over the following fact sheets that you were given.

## 2023-02-22 ENCOUNTER — Encounter (HOSPITAL_COMMUNITY): Payer: Self-pay

## 2023-02-22 ENCOUNTER — Encounter (HOSPITAL_COMMUNITY)
Admission: RE | Admit: 2023-02-22 | Discharge: 2023-02-22 | Disposition: A | Discharge status: Home / Self Care | Payer: Medicaid Other | Source: Ambulatory Visit | Attending: Neurosurgery | Admitting: Neurosurgery

## 2023-02-22 ENCOUNTER — Other Ambulatory Visit: Payer: Self-pay

## 2023-02-22 VITALS — BP 180/115 | HR 76 | Temp 98.0°F | Resp 18 | Ht 71.5 in | Wt 267.0 lb

## 2023-02-22 DIAGNOSIS — Z01818 Encounter for other preprocedural examination: Secondary | ICD-10-CM | POA: Diagnosis present

## 2023-02-22 DIAGNOSIS — Z1211 Encounter for screening for malignant neoplasm of colon: Secondary | ICD-10-CM

## 2023-02-22 DIAGNOSIS — Z79899 Other long term (current) drug therapy: Secondary | ICD-10-CM | POA: Insufficient documentation

## 2023-02-22 DIAGNOSIS — J45909 Unspecified asthma, uncomplicated: Secondary | ICD-10-CM | POA: Insufficient documentation

## 2023-02-22 DIAGNOSIS — Z87891 Personal history of nicotine dependence: Secondary | ICD-10-CM | POA: Diagnosis not present

## 2023-02-22 DIAGNOSIS — M50223 Other cervical disc displacement at C6-C7 level: Secondary | ICD-10-CM | POA: Insufficient documentation

## 2023-02-22 DIAGNOSIS — M4802 Spinal stenosis, cervical region: Secondary | ICD-10-CM | POA: Insufficient documentation

## 2023-02-22 DIAGNOSIS — R9431 Abnormal electrocardiogram [ECG] [EKG]: Secondary | ICD-10-CM | POA: Diagnosis not present

## 2023-02-22 DIAGNOSIS — K219 Gastro-esophageal reflux disease without esophagitis: Secondary | ICD-10-CM | POA: Diagnosis not present

## 2023-02-22 DIAGNOSIS — I10 Essential (primary) hypertension: Secondary | ICD-10-CM

## 2023-02-22 HISTORY — DX: Attention-deficit hyperactivity disorder, unspecified type: F90.9

## 2023-02-22 LAB — CBC
HCT: 45.5 % (ref 39.0–52.0)
Hemoglobin: 14.6 g/dL (ref 13.0–17.0)
MCH: 27.7 pg (ref 26.0–34.0)
MCHC: 32.1 g/dL (ref 30.0–36.0)
MCV: 86.2 fL (ref 80.0–100.0)
Platelets: 262 10*3/uL (ref 150–400)
RBC: 5.28 MIL/uL (ref 4.22–5.81)
RDW: 13.3 % (ref 11.5–15.5)
WBC: 6.5 10*3/uL (ref 4.0–10.5)
nRBC: 0 % (ref 0.0–0.2)

## 2023-02-22 LAB — BASIC METABOLIC PANEL
Anion gap: 7 (ref 5–15)
BUN: 11 mg/dL (ref 6–20)
CO2: 31 mmol/L (ref 22–32)
Calcium: 8.6 mg/dL — ABNORMAL LOW (ref 8.9–10.3)
Chloride: 100 mmol/L (ref 98–111)
Creatinine, Ser: 1.27 mg/dL — ABNORMAL HIGH (ref 0.61–1.24)
GFR, Estimated: >60 mL/min (ref >60)
Glucose, Bld: 96 mg/dL (ref 70–99)
Potassium: 3.6 mmol/L (ref 3.5–5.1)
Sodium: 138 mmol/L (ref 135–145)

## 2023-02-22 LAB — SURGICAL PCR SCREEN
MRSA, PCR: NEGATIVE
Staphylococcus aureus: POSITIVE — AB

## 2023-02-22 NOTE — Progress Notes (Addendum)
PCP - Dr.Xaje Hasanaj Cardiologist - pt denies  PPM/ICD - pt denies Device Orders - n/a Rep Notified - n/a  Chest x-ray - n/a EKG - 02/22/23 Stress Test - pt denies ECHO - pt denies Cardiac Cath - pt denies  Sleep Study - pt denies CPAP - n/a  Pt denies Diabetes  Last dose of GLP1 agonist-  pt denies GLP1 instructions: n/a  Blood Thinner Instructions:pt denies Aspirin Instructions:n/a  NPO after Midnight   COVID TEST- n/a   Anesthesia review: YES, HTN and abnormal EKG. BP 180/115 before PAT visit and 175/115 after PAT visit. Secure chat sent to [employer]. Per Allison-OK, just ask him to contact his PCP is his DBP is consistently > 100 and SBP is staying over 170-180's. Hopefully it's just a matter of him not taking meds + pain. Patient notified.   Patient denies shortness of breath, fever, cough and chest pain at PAT appointment   All instructions explained to the patient, with a verbal understanding of the material. Patient agrees to go over the instructions while at home for a better understanding. Patient also instructed to self quarantine after being tested for COVID-19. The opportunity to ask questions was provided.

## 2023-02-23 NOTE — Progress Notes (Signed)
Anesthesia Chart Review:   Case: [id number] Date/Time: 02/28/23 1054   Procedure: ACDF - C3-C4 - 3C   Anesthesia type: General   Pre-op diagnosis: HNP   Location: [facility] OR ROOM 18 / [facility] OR   Surgeons: Earnie Larsson, MD       DISCUSSION: Patient is a 37 year old male scheduled for the above procedure.  History includes former smoker (quit 02/01/23), HTN, asthma, GERD, spinal surgery (C-6 ACDF 12/21/21).  At PAT BP 175/115 and 180/115. He said he was in a rush to make it to his PAT appointment on 02/23/23, so he did not take his BP or pain medications. He did take methylprednisolone for his neck pain (on a Medrol Dosepak through ~ 02/23/23). He normally takes losartan 100 mg Q AM and amlodipine 10 mg Q AM.  He is able to monitor BP at home and reported last hone BP 137/85. Advised staff to have him to continue home BP medications as prescribed and monitor home readings, and if significantly elevated than to contact his PCP for recommendations.  He has had reasonable BP readings when on medication --readings in PAT elevated in setting of not yet taking him morning medication before rushing to his appointment. Staff did confirm that he did have his medications to take at home. His EKG is stable. He denied chest pain and SOB. I did leave a message for Lorriane Shire at Dr. Marchelle Folks office regarding BP. He will get vitals on arrival. Anesthesia team to evaluate on the day of surgery.    VS: BP (!) 180/115   Pulse 76   Temp 36.7 C   Resp 18   Ht 5' 11.5" (1.816 m)   Wt 121.1 kg   SpO2 99%   BMI 36.72 kg/m   PROVIDERS: Neale Burly, MD is PCP     LABS: Labs reviewed: Acceptable for surgery. (all labs ordered are listed, but only abnormal results are displayed)  Labs Reviewed  SURGICAL PCR SCREEN - Abnormal; Notable for the following components:      Result Value   Staphylococcus aureus POSITIVE (*)    All other components within normal limits  BASIC METABOLIC PANEL - Abnormal; Notable for the  following components:   Creatinine, Ser 1.27 (*)    Calcium 8.6 (*)    All other components within normal limits  CBC     IMAGES: MRI C-spine 02/07/23: IMPRESSION: 1. Prior ACDF at C4-5 and C5-6. Endplate spurring at 075-GRM with residual mild left-sided spinal stenosis. 2. 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 3. Subtle right paracentral to foraminal disc protrusion at C6-7 without significant stenosis.   EKG: 02/22/23: Normal sinus rhythm Nonspecific T wave abnormality Abnormal ECG When compared with ECG of 15-Dec-2021 15:44, Rate has increased Confirmed by Lenna Sciara ([id number]) on 02/23/2023 7:29:22 AM   CV: N/A  Past Medical History:  Diagnosis Date   ADHD (attention deficit hyperactivity disorder)    Asthma    GERD (gastroesophageal reflux disease)    Hypertension     Past Surgical History:  Procedure Laterality Date   ANTERIOR CERVICAL DECOMP/DISCECTOMY FUSION N/A 12/21/2021   Procedure: ANTERIOR CERVICAL WOUND EXPLORATION WITH EVACUATION OF HEMATOMA;  Surgeon: Consuella Lose, MD;  Location: [place];  Service: Neurosurgery;  Laterality: N/A;   ANTERIOR CERVICAL DECOMP/DISCECTOMY FUSION N/A 12/21/2021   Procedure: Anterior Cervical Decompression Fusion  - Cervical four-Cervical five -  Cervical five-Cervical six;  Surgeon: Earnie Larsson, MD;  Location: [place];  Service: Neurosurgery;  Laterality: N/A;   KNEE ARTHROSCOPY Left 11/14/2022   Procedure: LEFT KNEE ARTHROSCOPY, PARTIAL SYNOVECTOMY;  Surgeon: Marybelle Killings, MD;  Location: [place];  Service: Orthopedics;  Laterality: Left;   TONSILLECTOMY      MEDICATIONS:  albuterol (VENTOLIN HFA) 108 (90 Base) MCG/ACT inhaler   amLODipine (NORVASC) 10 MG tablet   cetirizine (ZYRTEC) 10 MG tablet   Colloidal Oatmeal (AVEENO ECZEMA THERAPY EX)   cyclobenzaprine  (FLEXERIL) 10 MG tablet   famotidine (PEPCID) 20 MG tablet   gabapentin (NEURONTIN) 100 MG capsule   losartan (COZAAR) 100 MG tablet   Menthol, Topical Analgesic, (STOPAIN EX)   methylPREDNISolone (MEDROL DOSEPAK) 4 MG TBPK tablet   omega-3 acid ethyl esters (LOVAZA) 1 g capsule   oxyCODONE (OXY IR/ROXICODONE) 5 MG immediate release tablet   oxyCODONE-acetaminophen (PERCOCET) 5-325 MG tablet   No current facility-administered medications for this encounter.    Myra Gianotti, PA-C Surgical Short Stay/Anesthesiology [facility] Phone [phone] [facility] Phone [phone] 02/23/2023 12:36 PM

## 2023-02-23 NOTE — Anesthesia Preprocedure Evaluation (Addendum)
Anesthesia Evaluation  Patient identified by MRN, date of birth, ID band Patient awake    Reviewed: Allergy & Precautions, NPO status , Patient's Chart, lab work & pertinent test results  History of Anesthesia Complications Negative for: history of anesthetic complications  Airway Mallampati: III  TM Distance: >3 FB Neck ROM: Limited   Comment: Previous grade I view with Glidescope 3 Dental  (+) Dental Advisory Given   Pulmonary neg shortness of breath, asthma (last inhaler use >1 month ago) , neg sleep apnea, neg COPD, neg recent URI, Patient abstained from smoking., former smoker   Pulmonary exam normal breath sounds clear to auscultation       Cardiovascular hypertension (amlodipine, hydralazine, losartan), Pt. on medications (-) angina (-) Past MI, (-) Cardiac Stents and (-) CABG (-) dysrhythmias  Rhythm:Regular Rate:Normal     Neuro/Psych neg Seizures PSYCHIATRIC DISORDERS (ADHD)       Neuromuscular disease (cervical spondylosis with myelopathy and radiculopathy)    GI/Hepatic Neg liver ROS,GERD  Medicated,,  Endo/Other  negative endocrine ROS    Renal/GU negative Renal ROS     Musculoskeletal  (+) Arthritis ,    Abdominal  (+) + obese  Peds  Hematology negative hematology ROS (+)   Anesthesia Other Findings   Reproductive/Obstetrics                             Anesthesia Physical Anesthesia Plan  ASA: 3  Anesthesia Plan: General   Post-op Pain Management: Tylenol PO (pre-op)* and Ketamine IV*   Induction: Intravenous  PONV Risk Score and Plan: 2 and Ondansetron, Dexamethasone and Treatment may vary due to age or medical condition  Airway Management Planned: Oral ETT and Video Laryngoscope Planned  Additional Equipment:   Intra-op Plan:   Post-operative Plan: Extubation in OR  Informed Consent: I have reviewed the patients History and Physical, chart, labs and discussed  the procedure including the risks, benefits and alternatives for the proposed anesthesia with the patient or authorized representative who has indicated his/her understanding and acceptance.     Dental advisory given  Plan Discussed with: CRNA and Anesthesiologist  Anesthesia Plan Comments: (Risks of general anesthesia discussed including, but not limited to, sore throat, hoarse voice, chipped/damaged teeth, injury to vocal cords, nausea and vomiting, allergic reactions, lung infection, heart attack, stroke, and death. All questions answered.   PAT note written 02/23/2023 by Myra Gianotti, PA-C.  )       Anesthesia Quick Evaluation

## 2023-02-28 ENCOUNTER — Ambulatory Visit (HOSPITAL_COMMUNITY): Payer: Medicaid Other

## 2023-02-28 ENCOUNTER — Encounter (HOSPITAL_COMMUNITY): Payer: Self-pay | Admitting: Neurosurgery

## 2023-02-28 ENCOUNTER — Other Ambulatory Visit: Payer: Self-pay

## 2023-02-28 ENCOUNTER — Ambulatory Visit (HOSPITAL_BASED_OUTPATIENT_CLINIC_OR_DEPARTMENT_OTHER): Payer: Medicaid Other | Admitting: Anesthesiology

## 2023-02-28 ENCOUNTER — Ambulatory Visit (HOSPITAL_COMMUNITY)
Admission: RE | Disposition: A | Discharge status: Home / Self Care | Payer: Self-pay | Source: Ambulatory Visit | Attending: Neurosurgery

## 2023-02-28 ENCOUNTER — Observation Stay (HOSPITAL_COMMUNITY)
Admission: RE | Admit: 2023-02-28 | Discharge: 2023-03-01 | Disposition: A | Discharge status: Home / Self Care | Payer: Medicaid Other | Source: Ambulatory Visit | Attending: Neurosurgery | Admitting: Neurosurgery

## 2023-02-28 ENCOUNTER — Ambulatory Visit (HOSPITAL_COMMUNITY): Payer: Medicaid Other | Admitting: Vascular Surgery

## 2023-02-28 DIAGNOSIS — M4802 Spinal stenosis, cervical region: Secondary | ICD-10-CM | POA: Diagnosis not present

## 2023-02-28 DIAGNOSIS — Z79899 Other long term (current) drug therapy: Secondary | ICD-10-CM | POA: Insufficient documentation

## 2023-02-28 DIAGNOSIS — Z87891 Personal history of nicotine dependence: Secondary | ICD-10-CM

## 2023-02-28 DIAGNOSIS — G959 Disease of spinal cord, unspecified: Secondary | ICD-10-CM | POA: Diagnosis present

## 2023-02-28 DIAGNOSIS — M5001 Cervical disc disorder with myelopathy,  high cervical region: Secondary | ICD-10-CM

## 2023-02-28 DIAGNOSIS — J45909 Unspecified asthma, uncomplicated: Secondary | ICD-10-CM | POA: Diagnosis not present

## 2023-02-28 DIAGNOSIS — I10 Essential (primary) hypertension: Secondary | ICD-10-CM

## 2023-02-28 DIAGNOSIS — E669 Obesity, unspecified: Secondary | ICD-10-CM | POA: Diagnosis not present

## 2023-02-28 DIAGNOSIS — Z6836 Body mass index (BMI) 36.0-36.9, adult: Secondary | ICD-10-CM

## 2023-02-28 HISTORY — PX: ANTERIOR CERVICAL DECOMP/DISCECTOMY FUSION: SHX1161

## 2023-02-28 SURGERY — ANTERIOR CERVICAL DECOMPRESSION/DISCECTOMY FUSION 1 LEVEL
Anesthesia: General | Site: Spine Cervical

## 2023-02-28 MED ORDER — THROMBIN 20000 UNITS EX SOLR
CUTANEOUS | Status: AC
  Filled 2023-02-28: qty 20000

## 2023-02-28 MED ORDER — MENTHOL 3 MG MT LOZG: 1.0000 | LOZENGE | OROMUCOSAL | Status: DC | PRN

## 2023-02-28 MED ORDER — AMISULPRIDE (ANTIEMETIC) 5 MG/2ML IV SOLN
INTRAVENOUS | Status: AC
  Filled 2023-02-28: qty 4

## 2023-02-28 MED ORDER — GABAPENTIN 100 MG PO CAPS
100.0000 mg | ORAL_CAPSULE | Freq: Three times a day (TID) | ORAL | Status: DC
  Administered 2023-03-01: 100 mg via ORAL
  Filled 2023-02-28: qty 1

## 2023-02-28 MED ORDER — HYDROMORPHONE HCL 1 MG/ML IJ SOLN
INTRAMUSCULAR | Status: DC | PRN
  Administered 2023-02-28: .5 mg via INTRAVENOUS

## 2023-02-28 MED ORDER — HYDROMORPHONE HCL 1 MG/ML IJ SOLN
1.0000 mg | INTRAMUSCULAR | Status: DC | PRN
  Administered 2023-02-28 (×2): 1 mg via INTRAVENOUS
  Filled 2023-02-28 (×2): qty 1

## 2023-02-28 MED ORDER — LIDOCAINE 2% (20 MG/ML) 5 ML SYRINGE
INTRAMUSCULAR | Status: AC
  Filled 2023-02-28: qty 10

## 2023-02-28 MED ORDER — FENTANYL CITRATE (PF) 250 MCG/5ML IJ SOLN
INTRAMUSCULAR | Status: AC
  Filled 2023-02-28: qty 5

## 2023-02-28 MED ORDER — ACETAMINOPHEN 650 MG RE SUPP: 650.0000 mg | RECTAL | Status: DC | PRN

## 2023-02-28 MED ORDER — HYDROCODONE-ACETAMINOPHEN 10-325 MG PO TABS: 2.0000 | ORAL_TABLET | ORAL | Status: DC | PRN

## 2023-02-28 MED ORDER — SODIUM CHLORIDE 0.9 % IV SOLN: 250.0000 mL | INTRAVENOUS | Status: DC

## 2023-02-28 MED ORDER — HYDRALAZINE HCL 50 MG PO TABS
25.0000 mg | ORAL_TABLET | Freq: Two times a day (BID) | ORAL | Status: DC
  Administered 2023-02-28 – 2023-03-01 (×2): 25 mg via ORAL
  Filled 2023-02-28 (×2): qty 1

## 2023-02-28 MED ORDER — DEXAMETHASONE SODIUM PHOSPHATE 10 MG/ML IJ SOLN
INTRAMUSCULAR | Status: DC | PRN
  Administered 2023-02-28: 10 mg via INTRAVENOUS

## 2023-02-28 MED ORDER — LIDOCAINE 2% (20 MG/ML) 5 ML SYRINGE
INTRAMUSCULAR | Status: DC | PRN
  Administered 2023-02-28: 100 mg via INTRAVENOUS

## 2023-02-28 MED ORDER — VANCOMYCIN HCL 1000 MG IV SOLR
INTRAVENOUS | Status: DC | PRN
  Administered 2023-02-28: 1500 mg via INTRAVENOUS

## 2023-02-28 MED ORDER — PROPOFOL 10 MG/ML IV BOLUS
INTRAVENOUS | Status: AC
  Filled 2023-02-28: qty 20

## 2023-02-28 MED ORDER — ONDANSETRON HCL 4 MG/2ML IJ SOLN
4.0000 mg | Freq: Four times a day (QID) | INTRAMUSCULAR | Status: DC | PRN
  Administered 2023-02-28: 4 mg via INTRAVENOUS
  Filled 2023-02-28: qty 2

## 2023-02-28 MED ORDER — LOSARTAN POTASSIUM 50 MG PO TABS
100.0000 mg | ORAL_TABLET | Freq: Every day | ORAL | Status: DC
  Administered 2023-03-01: 100 mg via ORAL
  Filled 2023-02-28: qty 2

## 2023-02-28 MED ORDER — ACETAMINOPHEN 500 MG PO TABS
1000.0000 mg | ORAL_TABLET | Freq: Once | ORAL | Status: AC
  Administered 2023-02-28: 1000 mg via ORAL
  Filled 2023-02-28: qty 2

## 2023-02-28 MED ORDER — OMEGA-3-ACID ETHYL ESTERS 1 G PO CAPS
1.0000 g | ORAL_CAPSULE | Freq: Three times a day (TID) | ORAL | Status: DC
  Administered 2023-03-01: 1 g via ORAL
  Filled 2023-02-28: qty 1

## 2023-02-28 MED ORDER — EPHEDRINE SULFATE-NACL 50-0.9 MG/10ML-% IV SOSY
PREFILLED_SYRINGE | INTRAVENOUS | Status: DC | PRN
  Administered 2023-02-28 (×2): 5 mg via INTRAVENOUS

## 2023-02-28 MED ORDER — FENTANYL CITRATE (PF) 250 MCG/5ML IJ SOLN
INTRAMUSCULAR | Status: DC | PRN
  Administered 2023-02-28: 150 ug via INTRAVENOUS
  Administered 2023-02-28 (×2): 50 ug via INTRAVENOUS

## 2023-02-28 MED ORDER — SODIUM CHLORIDE 0.9% FLUSH
3.0000 mL | Freq: Two times a day (BID) | INTRAVENOUS | Status: DC
  Administered 2023-02-28: 3 mL via INTRAVENOUS

## 2023-02-28 MED ORDER — ROCURONIUM BROMIDE 10 MG/ML (PF) SYRINGE
PREFILLED_SYRINGE | INTRAVENOUS | Status: DC | PRN
  Administered 2023-02-28: 20 mg via INTRAVENOUS
  Administered 2023-02-28 (×2): 50 mg via INTRAVENOUS

## 2023-02-28 MED ORDER — ONDANSETRON HCL 4 MG/2ML IJ SOLN
INTRAMUSCULAR | Status: DC | PRN
  Administered 2023-02-28: 4 mg via INTRAVENOUS

## 2023-02-28 MED ORDER — OXYCODONE-ACETAMINOPHEN 5-325 MG PO TABS
1.0000 | ORAL_TABLET | ORAL | Status: DC | PRN
  Administered 2023-02-28 – 2023-03-01 (×3): 2 via ORAL
  Filled 2023-02-28 (×3): qty 2

## 2023-02-28 MED ORDER — HYDROMORPHONE HCL 1 MG/ML IJ SOLN
INTRAMUSCULAR | Status: AC
  Filled 2023-02-28: qty 0.5

## 2023-02-28 MED ORDER — OXYCODONE HCL 5 MG PO TABS: 5.0000 mg | ORAL_TABLET | Freq: Once | ORAL | Status: DC | PRN

## 2023-02-28 MED ORDER — EPHEDRINE 5 MG/ML INJ
INTRAVENOUS | Status: AC
  Filled 2023-02-28: qty 5

## 2023-02-28 MED ORDER — ONDANSETRON HCL 4 MG/2ML IJ SOLN
INTRAMUSCULAR | Status: AC
  Filled 2023-02-28: qty 2

## 2023-02-28 MED ORDER — PROPOFOL 10 MG/ML IV BOLUS
INTRAVENOUS | Status: DC | PRN
  Administered 2023-02-28: 200 mg via INTRAVENOUS

## 2023-02-28 MED ORDER — DIPHENHYDRAMINE HCL 50 MG/ML IJ SOLN
INTRAMUSCULAR | Status: DC | PRN
  Administered 2023-02-28: 12.5 mg via INTRAVENOUS

## 2023-02-28 MED ORDER — DEXMEDETOMIDINE HCL IN NACL 80 MCG/20ML IV SOLN
INTRAVENOUS | Status: DC | PRN
  Administered 2023-02-28: 8 ug via BUCCAL
  Administered 2023-02-28: 4 ug via BUCCAL
  Administered 2023-02-28: 8 ug via BUCCAL

## 2023-02-28 MED ORDER — AMISULPRIDE (ANTIEMETIC) 5 MG/2ML IV SOLN
10.0000 mg | Freq: Once | INTRAVENOUS | Status: AC | PRN
  Administered 2023-02-28: 10 mg via INTRAVENOUS

## 2023-02-28 MED ORDER — DEXAMETHASONE SODIUM PHOSPHATE 10 MG/ML IJ SOLN
INTRAMUSCULAR | Status: AC
  Filled 2023-02-28: qty 1

## 2023-02-28 MED ORDER — CYCLOBENZAPRINE HCL 10 MG PO TABS
10.0000 mg | ORAL_TABLET | Freq: Three times a day (TID) | ORAL | Status: DC | PRN
  Administered 2023-02-28 – 2023-03-01 (×2): 10 mg via ORAL
  Filled 2023-02-28 (×2): qty 1

## 2023-02-28 MED ORDER — LACTATED RINGERS IV SOLN: INTRAVENOUS | Status: DC

## 2023-02-28 MED ORDER — KETAMINE HCL 10 MG/ML IJ SOLN
INTRAMUSCULAR | Status: DC | PRN
  Administered 2023-02-28: 30 mg via INTRAVENOUS

## 2023-02-28 MED ORDER — KETAMINE HCL 50 MG/5ML IJ SOSY
PREFILLED_SYRINGE | INTRAMUSCULAR | Status: AC
  Filled 2023-02-28: qty 5

## 2023-02-28 MED ORDER — AMLODIPINE BESYLATE 10 MG PO TABS
10.0000 mg | ORAL_TABLET | Freq: Every day | ORAL | Status: DC
  Administered 2023-03-01: 10 mg via ORAL
  Filled 2023-02-28: qty 1

## 2023-02-28 MED ORDER — LORATADINE 10 MG PO TABS
10.0000 mg | ORAL_TABLET | Freq: Every day | ORAL | Status: DC
  Administered 2023-03-01: 10 mg via ORAL
  Filled 2023-02-28: qty 1

## 2023-02-28 MED ORDER — HYDROCODONE-ACETAMINOPHEN 5-325 MG PO TABS: 1.0000 | ORAL_TABLET | ORAL | Status: DC | PRN

## 2023-02-28 MED ORDER — MIDAZOLAM HCL 2 MG/2ML IJ SOLN
INTRAMUSCULAR | Status: DC | PRN
  Administered 2023-02-28: 2 mg via INTRAVENOUS

## 2023-02-28 MED ORDER — FAMOTIDINE 20 MG PO TABS
20.0000 mg | ORAL_TABLET | Freq: Every day | ORAL | Status: DC
  Administered 2023-03-01: 20 mg via ORAL
  Filled 2023-02-28: qty 1

## 2023-02-28 MED ORDER — SODIUM CHLORIDE 0.9% FLUSH: 3.0000 mL | INTRAVENOUS | Status: DC | PRN

## 2023-02-28 MED ORDER — ROCURONIUM BROMIDE 10 MG/ML (PF) SYRINGE
PREFILLED_SYRINGE | INTRAVENOUS | Status: AC
  Filled 2023-02-28: qty 20

## 2023-02-28 MED ORDER — PHENOL 1.4 % MT LIQD: 1.0000 | OROMUCOSAL | Status: DC | PRN

## 2023-02-28 MED ORDER — ACETAMINOPHEN 325 MG PO TABS
650.0000 mg | ORAL_TABLET | ORAL | Status: DC | PRN
  Filled 2023-02-28: qty 2

## 2023-02-28 MED ORDER — ORAL CARE MOUTH RINSE: 15.0000 mL | Freq: Once | OROMUCOSAL | Status: AC

## 2023-02-28 MED ORDER — CHLORHEXIDINE GLUCONATE CLOTH 2 % EX PADS: 6.0000 | MEDICATED_PAD | Freq: Once | CUTANEOUS | Status: DC

## 2023-02-28 MED ORDER — FENTANYL CITRATE (PF) 100 MCG/2ML IJ SOLN: 25.0000 ug | INTRAMUSCULAR | Status: DC | PRN

## 2023-02-28 MED ORDER — PHENYLEPHRINE 80 MCG/ML (10ML) SYRINGE FOR IV PUSH (FOR BLOOD PRESSURE SUPPORT)
PREFILLED_SYRINGE | INTRAVENOUS | Status: DC | PRN
  Administered 2023-02-28: 80 ug via INTRAVENOUS
  Administered 2023-02-28: 160 ug via INTRAVENOUS
  Administered 2023-02-28 (×2): 80 ug via INTRAVENOUS
  Administered 2023-02-28 (×2): 160 ug via INTRAVENOUS

## 2023-02-28 MED ORDER — THROMBIN 20000 UNITS EX SOLR
CUTANEOUS | Status: DC | PRN
  Administered 2023-02-28: 20 mL via TOPICAL

## 2023-02-28 MED ORDER — CHLORHEXIDINE GLUCONATE 0.12 % MT SOLN
15.0000 mL | Freq: Once | OROMUCOSAL | Status: AC
  Administered 2023-02-28: 15 mL via OROMUCOSAL
  Filled 2023-02-28: qty 15

## 2023-02-28 MED ORDER — MIDAZOLAM HCL 2 MG/2ML IJ SOLN
INTRAMUSCULAR | Status: AC
  Filled 2023-02-28: qty 2

## 2023-02-28 MED ORDER — ONDANSETRON HCL 4 MG PO TABS: 4.0000 mg | ORAL_TABLET | Freq: Four times a day (QID) | ORAL | Status: DC | PRN

## 2023-02-28 MED ORDER — OXYCODONE HCL 5 MG/5ML PO SOLN: 5.0000 mg | Freq: Once | ORAL | Status: DC | PRN

## 2023-02-28 MED ORDER — PHENYLEPHRINE HCL-NACL 20-0.9 MG/250ML-% IV SOLN
INTRAVENOUS | Status: DC | PRN
  Administered 2023-02-28: 50 ug/min via INTRAVENOUS

## 2023-02-28 MED ORDER — ALBUTEROL SULFATE (2.5 MG/3ML) 0.083% IN NEBU: 2.5000 mg | INHALATION_SOLUTION | Freq: Four times a day (QID) | RESPIRATORY_TRACT | Status: DC | PRN

## 2023-02-28 MED ORDER — SUGAMMADEX SODIUM 200 MG/2ML IV SOLN
INTRAVENOUS | Status: DC | PRN
  Administered 2023-02-28: 400 mg via INTRAVENOUS

## 2023-02-28 MED ORDER — 0.9 % SODIUM CHLORIDE (POUR BTL) OPTIME
TOPICAL | Status: DC | PRN
  Administered 2023-02-28: 1000 mL

## 2023-02-28 MED ORDER — VANCOMYCIN HCL IN DEXTROSE 1-5 GM/200ML-% IV SOLN
1000.0000 mg | Freq: Once | INTRAVENOUS | Status: AC
  Administered 2023-02-28: 1000 mg via INTRAVENOUS
  Filled 2023-02-28: qty 200

## 2023-02-28 MED ORDER — HYDROMORPHONE HCL 1 MG/ML IJ SOLN: 0.2500 mg | INTRAMUSCULAR | Status: DC | PRN

## 2023-02-28 MED ORDER — VANCOMYCIN HCL 1500 MG/300ML IV SOLN
1500.0000 mg | INTRAVENOUS | Status: AC
  Administered 2023-02-28: 1500 mg via INTRAVENOUS
  Filled 2023-02-28: qty 300

## 2023-02-28 SURGICAL SUPPLY — 55 items
APL SKNCLS STERI-STRIP NONHPOA (GAUZE/BANDAGES/DRESSINGS) ×1
BAG COUNTER SPONGE SURGICOUNT (BAG) ×1 IMPLANT
BAG DECANTER FOR FLEXI CONT (MISCELLANEOUS) ×1 IMPLANT
BAG SPNG CNTER NS LX DISP (BAG) ×1
BAND INSRT 18 STRL LF DISP RB (MISCELLANEOUS) ×2
BAND RUBBER #18 3X1/16 STRL (MISCELLANEOUS) ×2 IMPLANT
BENZOIN TINCTURE PRP APPL 2/3 (GAUZE/BANDAGES/DRESSINGS) ×1 IMPLANT
BIT DRILL 13 (BIT) IMPLANT
BUR MATCHSTICK NEURO 3.0 LAGG (BURR) ×1 IMPLANT
CAGE PEEK 7X14X11 (Cage) ×1 IMPLANT
CANISTER SUCT 3000ML PPV (MISCELLANEOUS) ×1 IMPLANT
DRAPE C-ARM 42X72 X-RAY (DRAPES) ×2 IMPLANT
DRAPE LAPAROTOMY 100X72 PEDS (DRAPES) ×1 IMPLANT
DRAPE MICROSCOPE SLANT 54X150 (MISCELLANEOUS) ×1 IMPLANT
DURAPREP 6ML APPLICATOR 50/CS (WOUND CARE) ×1 IMPLANT
ELECT COATED BLADE 2.86 ST (ELECTRODE) ×1 IMPLANT
ELECT REM PT RETURN 9FT ADLT (ELECTROSURGICAL) ×1
ELECTRODE REM PT RTRN 9FT ADLT (ELECTROSURGICAL) ×1 IMPLANT
GAUZE 4X4 16PLY ~~LOC~~+RFID DBL (SPONGE) IMPLANT
GAUZE SPONGE 4X4 12PLY STRL (GAUZE/BANDAGES/DRESSINGS) ×1 IMPLANT
GLOVE ECLIPSE 9.0 STRL (GLOVE) ×1 IMPLANT
GLOVE EXAM NITRILE XL STR (GLOVE) IMPLANT
GOWN STRL REUS W/ TWL LRG LVL3 (GOWN DISPOSABLE) IMPLANT
GOWN STRL REUS W/ TWL XL LVL3 (GOWN DISPOSABLE) IMPLANT
GOWN STRL REUS W/TWL 2XL LVL3 (GOWN DISPOSABLE) IMPLANT
GOWN STRL REUS W/TWL LRG LVL3 (GOWN DISPOSABLE)
GOWN STRL REUS W/TWL XL LVL3 (GOWN DISPOSABLE)
HALTER HD/CHIN CERV TRACTION D (MISCELLANEOUS) ×1 IMPLANT
HEMOSTAT POWDER KIT SURGIFOAM (HEMOSTASIS) IMPLANT
KIT BASIN OR (CUSTOM PROCEDURE TRAY) ×1 IMPLANT
KIT TURNOVER KIT B (KITS) ×1 IMPLANT
NDL SPNL 20GX3.5 QUINCKE YW (NEEDLE) ×1 IMPLANT
NEEDLE SPNL 20GX3.5 QUINCKE YW (NEEDLE) ×1 IMPLANT
NS IRRIG 1000ML POUR BTL (IV SOLUTION) ×1 IMPLANT
PACK LAMINECTOMY NEURO (CUSTOM PROCEDURE TRAY) ×1 IMPLANT
PAD ARMBOARD 7.5X6 YLW CONV (MISCELLANEOUS) ×3 IMPLANT
PLATE VISION ELITE 27.5MM (Plate) IMPLANT
PUTTY DBX 1CC (Putty) ×1 IMPLANT
PUTTY DBX 1CC DEPUY (Putty) IMPLANT
SCREW ST 13X4XST VA NS SPNE (Screw) IMPLANT
SCREW ST VAR 4 ATL (Screw) ×4 IMPLANT
SPACER SPNL 11X14X7XPEEK CVD (Cage) IMPLANT
SPCR SPNL 11X14X7XPEEK CVD (Cage) ×1 IMPLANT
SPONGE INTESTINAL PEANUT (DISPOSABLE) ×1 IMPLANT
SPONGE SURGIFOAM ABS GEL 100 (HEMOSTASIS) IMPLANT
SPONGE SURGIFOAM ABS GEL SZ50 (HEMOSTASIS) ×1 IMPLANT
STRIP CLOSURE SKIN 1/2X4 (GAUZE/BANDAGES/DRESSINGS) ×1 IMPLANT
SUT VIC AB 3-0 SH 8-18 (SUTURE) ×1 IMPLANT
SUT VIC AB 4-0 RB1 18 (SUTURE) ×1 IMPLANT
TAPE CLOTH 4X10 WHT NS (GAUZE/BANDAGES/DRESSINGS) ×1 IMPLANT
TAPE CLOTH SURG 4X10 WHT LF (GAUZE/BANDAGES/DRESSINGS) IMPLANT
TOWEL GREEN STERILE (TOWEL DISPOSABLE) ×1 IMPLANT
TOWEL GREEN STERILE FF (TOWEL DISPOSABLE) ×1 IMPLANT
TRAP SPECIMEN MUCUS 40CC (MISCELLANEOUS) ×1 IMPLANT
WATER STERILE IRR 1000ML POUR (IV SOLUTION) ×1 IMPLANT

## 2023-02-28 NOTE — Brief Op Note (Signed)
02/28/2023  2:24 PM  PATIENT:  Violet Baldy  37 y.o. male  PRE-OPERATIVE DIAGNOSIS:  HNP  POST-OPERATIVE DIAGNOSIS:  HNP  PROCEDURE:  Procedure(s): Anterior Cervical Decompression Fusion - Cervical three-Cervical four (N/A)  SURGEON:  Surgeon(s) and Role:    * Earnie Larsson, MD - Primary  PHYSICIAN ASSISTANT:   ASSISTANTSMearl Latin   ANESTHESIA:   general  EBL:  50 mL   BLOOD ADMINISTERED:none  DRAINS: none   LOCAL MEDICATIONS USED:  NONE  SPECIMEN:  No Specimen  DISPOSITION OF SPECIMEN:  N/A  COUNTS:  YES  TOURNIQUET:  * No tourniquets in log *  DICTATION: .Dragon Dictation  PLAN OF CARE: Admit for overnight observation  PATIENT DISPOSITION:  PACU - hemodynamically stable.   Delay start of Pharmacological VTE agent (>24hrs) due to surgical blood loss or risk of bleeding: yes

## 2023-02-28 NOTE — H&P (Signed)
Ricky Cook is an 37 y.o. male.   Chief Complaint: Neck pain HPI: 37 year old male status post prior C4-5 and C5-6 anterior cervical septic infusion for treatment of compressive cervical myelopathy presents with worsening neck pain and bilateral upper extremity numbness and some early weakness right greater than left.  Workup demonstrates evidence for focal right paracentral disc herniation at C3-4 with marked cord compression.  Patient presents now for C3-4 anterior cervical discectomy and fusion in hopes of improving his symptoms.  Past Medical History:  Diagnosis Date   ADHD (attention deficit hyperactivity disorder)    Asthma    GERD (gastroesophageal reflux disease)    Hypertension     Past Surgical History:  Procedure Laterality Date   ANTERIOR CERVICAL DECOMP/DISCECTOMY FUSION N/A 12/21/2021   Procedure: ANTERIOR CERVICAL WOUND EXPLORATION WITH EVACUATION OF HEMATOMA;  Surgeon: Consuella Lose, MD;  Location: [facility];  Service: Neurosurgery;  Laterality: N/A;   ANTERIOR CERVICAL DECOMP/DISCECTOMY FUSION N/A 12/21/2021   Procedure: Anterior Cervical Decompression Fusion  - Cervical four-Cervical five - Cervical five-Cervical six;  Surgeon: Earnie Larsson, MD;  Location: [facility];  Service: Neurosurgery;  Laterality: N/A;   KNEE ARTHROSCOPY Left 11/14/2022   Procedure: LEFT KNEE ARTHROSCOPY, PARTIAL SYNOVECTOMY;  Surgeon: Marybelle Killings, MD;  Location: [facility];  Service: Orthopedics;  Laterality: Left;   TONSILLECTOMY      Family History  Problem Relation Age of Onset   Hypertension Mother    Diabetes Mother    Hypercholesterolemia Father    Social History:  reports that he quit smoking about 3 weeks ago. His smoking use included cigarettes. He smoked an average of .25 packs per day. He has never used smokeless tobacco. He reports that he does not currently use drugs. He reports that he does not drink alcohol.  Allergies:  Allergies  Allergen Reactions   Ceftin  [Cefuroxime Axetil] Shortness Of Breath   Augmentin [Amoxicillin-Pot Clavulanate] Other (See Comments)    thrush   Hydrocodone Hives and Other (See Comments)    constipation   Sulfa Antibiotics Other (See Comments)    Thrush    Medications Prior to Admission  Medication Sig Dispense Refill   amLODipine (NORVASC) 10 MG tablet Take 10 mg by mouth in the morning.     cetirizine (ZYRTEC) 10 MG tablet Take 10 mg by mouth in the morning.     Colloidal Oatmeal (AVEENO ECZEMA THERAPY EX) Apply 1 Application topically daily. Applied to face daily     cyclobenzaprine (FLEXERIL) 10 MG tablet Take 10 mg by mouth 3 (three) times daily as needed for muscle spasms.     famotidine (PEPCID) 20 MG tablet Take 20 mg by mouth in the morning.     gabapentin (NEURONTIN) 100 MG capsule Take 100 mg by mouth 3 (three) times daily.     hydrALAZINE (APRESOLINE) 25 MG tablet Take 25 mg by mouth 2 (two) times daily.     losartan (COZAAR) 100 MG tablet Take 100 mg by mouth in the morning.     Menthol, Topical Analgesic, (STOPAIN EX) Apply 1 Application topically 3 (three) times daily as needed (pain.). Maximum performance pain relief topical     methylPREDNISolone (MEDROL DOSEPAK) 4 MG TBPK tablet Take 4-24 mg by mouth See admin instructions. Take these number of tablets on consecutive days:6-5-4-3-2-1     oxyCODONE (OXY IR/ROXICODONE) 5 MG immediate release tablet Take 5 mg by mouth every 6 (six) hours as needed (pain.).     albuterol (VENTOLIN  HFA) 108 (90 Base) MCG/ACT inhaler Inhale 1-2 puffs into the lungs every 6 (six) hours as needed for wheezing or shortness of breath.     omega-3 acid ethyl esters (LOVAZA) 1 g capsule Take 1 g by mouth in the morning, at noon, and at bedtime.     oxyCODONE-acetaminophen (PERCOCET) 5-325 MG tablet Take 1-2 tablets by mouth every 6 (six) hours as needed for severe pain. (Patient not taking: Reported on 02/20/2023) 40 tablet 0    No results found for this or any previous visit  (from the past 48 hour(s)). No results found.  Pertinent items noted in HPI and remainder of comprehensive ROS otherwise negative.  Blood pressure (!) 163/103, pulse 84, temperature 98.1 F (36.7 C), resp. rate 18, height 5' 11.5" (1.816 m), weight 119.7 kg, SpO2 99 %.  Patient is awake and alert.  He is oriented and appropriate.  Speech is fluent.  Judgment insight are intact.  Cranial nerve function normal bilateral.  Motor examination reveals some mild distal weakness in both upper extremities.  Sensory examination with patchy distal sensory loss in both upper extremities.  Reflexes are brisk.  Gait is unsteady.  Examination head ears eyes nose throat is unremarked.  Chest and abdomen are benign.  Extremities are free from injury or deformity. Assessment/Plan C3-4 adjacent level disc herniation with stenosis and cervical myelopathy.  Plan C3-4 anterior cervical discectomy and fusion.  Risks and benefits been explained.  Patient wishes to proceed.  Mallie Mussel A Koni Kannan 02/28/2023, 11:45 AM

## 2023-02-28 NOTE — Progress Notes (Signed)
Orthopedic Tech Progress Note Patient Details:  Ricky Cook 10/01/1986 [id number]  Ortho Devices Type of Ortho Device: Soft collar Ortho Device/Splint Location: Neck Ortho Device/Splint Interventions: Ordered, Application   Post Interventions Patient Tolerated: Well  [name] 02/28/2023, 3:22 PM

## 2023-02-28 NOTE — Anesthesia Procedure Notes (Signed)
Procedure Name: Intubation Date/Time: 02/28/2023 12:42 PM  Performed by: Griffin Dakin, CRNAPre-anesthesia Checklist: Patient identified, Emergency Drugs available, Suction available and Patient being monitored Patient Re-evaluated:Patient Re-evaluated prior to induction Oxygen Delivery Method: Circle system utilized Preoxygenation: Pre-oxygenation with 100% oxygen Induction Type: IV induction Ventilation: Mask ventilation without difficulty Laryngoscope Size: Glidescope Tube type: Oral Tube size: 7.5 mm Number of attempts: 1 Airway Equipment and Method: Stylet and Oral airway Placement Confirmation: ETT inserted through vocal cords under direct vision, positive ETCO2 and breath sounds checked- equal and bilateral Secured at: 23 cm Tube secured with: Tape Dental Injury: Teeth and Oropharynx as per pre-operative assessment

## 2023-02-28 NOTE — Anesthesia Postprocedure Evaluation (Signed)
Anesthesia Post Note  Patient: Ricky Cook  Procedure(s) Performed: Anterior Cervical Decompression Fusion - Cervical three-Cervical four (Spine Cervical)     Patient location during evaluation: PACU Anesthesia Type: General Level of consciousness: awake Pain management: pain level controlled Vital Signs Assessment: post-procedure vital signs reviewed and stable Respiratory status: spontaneous breathing, nonlabored ventilation and respiratory function stable Cardiovascular status: blood pressure returned to baseline and stable Postop Assessment: no apparent nausea or vomiting Anesthetic complications: no   No notable events documented.  Last Vitals:  Vitals:   02/28/23 1537 02/28/23 2015  BP: (!) 158/88 (!) 168/100  Pulse: 80 94  Resp: 18 18  Temp:  36.7 C  SpO2: 97% 100%    Last Pain:  Vitals:   02/28/23 2024  TempSrc:   PainSc: 8                  Nilda Simmer

## 2023-02-28 NOTE — Progress Notes (Signed)
Pharmacy Antibiotic Note  Ricky Cook is a 37 y.o. male admitted on 02/28/2023 for cervical disc surgery.  Pharmacy has been consulted for Vancomycin dosing x 1 dose post-op for surgical prophylaxis.    Vancomycin 1500 mg IV given at ~10am pre-op.  No drain.   Plan: Vancomycin 1gm IV x 1 at 10pm tonight. No follow up needed. Pharmacy signing off.  Height: 5' 11.5" (181.6 cm) Weight: 119.7 kg (264 lb) IBW/kg (Calculated) : 76.45  Temp (24hrs), Avg:97.8 F (36.6 C), Min:97.6 F (36.4 C), Max:98.1 F (36.7 C)  Recent Labs  Lab 02/22/23 1322  WBC 6.5  CREATININE 1.27*    Estimated Creatinine Clearance: 106.7 mL/min (A) (by C-G formula based on SCr of 1.27 mg/dL (H)).    Allergies  Allergen Reactions   Ceftin [Cefuroxime Axetil] Shortness Of Breath   Augmentin [Amoxicillin-Pot Clavulanate] Other (See Comments)    thrush   Hydrocodone Hives and Other (See Comments)    constipation   Sulfa Antibiotics Other (See Comments)    Thrush    Thank you for allowing pharmacy to be a part of this patient's care.  Arty Baumgartner, [place] 02/28/2023 5:01 PM

## 2023-02-28 NOTE — Transfer of Care (Signed)
Immediate Anesthesia Transfer of Care Note  Patient: Ricky Cook  Procedure(s) Performed: Anterior Cervical Decompression Fusion - Cervical three-Cervical four (Spine Cervical)  Patient Location: PACU  Anesthesia Type:General  Level of Consciousness: awake, alert , and oriented  Airway & Oxygen Therapy: Patient Spontanous Breathing  Post-op Assessment: Report given to RN and Post -op Vital signs reviewed and stable  Post vital signs: Reviewed and stable  Last Vitals:  Vitals Value Taken Time  BP    Temp    Pulse    Resp    SpO2      Last Pain:  Vitals:   02/28/23 0941  PainSc: 8       Patients Stated Pain Goal: 0 ([id number])  Complications: No notable events documented.

## 2023-02-28 NOTE — Op Note (Signed)
Date of procedure: 02/28/2023  Date of dictation: Same  Service: Neurosurgery  Preoperative diagnosis: C3-4 herniated nucleus pulposus with myelopathy  Postoperative diagnosis: Same  Procedure Name: C3-4 anterior cervical discectomy with interbody fusion utilizing interbody cage, local autograft, morselized allograft and anterior plate instrumentation  Removal of C4-C6 anterior instrumentation  Surgeon:Landan Fedie A.Diago Haik, M.D.  Asst. Surgeon: Reinaldo Meeker, NP  Anesthesia: General  Indication: 37 year old male remotely status post C4-C6 anterior cervical decompression and fusion presents with severe neck pain worse on the right side with some progressive numbness and some early weakness in his right upper extremity.  Workup demonstrates evidence for large paracentral disc herniation with marked cord compression on the right at C3-4.  Patient with prior anterior cervical fusion from [id number] without complicating features.  Fusion appears stable on bending x-rays.  Patient presents now for C3-4 anterior cervical discectomy and fusion in hopes of improving his symptoms.  Operative note: After induction of anesthesia, patient positioned supine with neck slightly extended and held placeholder traction.  Patient's anterior cervical region prepped and draped sterilely.  Incision made overlying C5.  Dissection performed on the right.  Previous plate was dissected free, disassembled and removed.  Fusion inspected and found to be solid.  Dissection proceeded cephalad and the C3-4 space was dissected free.  Retractor placed.  Disc base incised.  Discectomy performed using various instruments down to level the posterior annulus.  Microscope was then brought to the field used throughout the remainder of the discectomy.  Remaining aspects of annulus and osteophytes were removed using high-speed drill down throughout the posterior logical ligament.  Posterior logical was then elevated and resected in a piecemeal fashion.   Underlying thecal sac was identified.  A wide central decompression then performed undercutting the bodies of C3 and C4 including removal of all elements of the disc herniation at C3-4.  Anterior foraminotomies performed on the course exiting C4 nerve roots.  At this point a very thorough decompression had been achieved.  There was no evidence of injury to the thecal sac or nerve roots.  Wound was irrigated.  Gelfoam was placed topically for hemostasis then removed.  7 mm Medtronic anatomic peek cage was packed with locally harvested autograft and demineralized bone matrix.  This then impacted in the place.  A 27 mm Atlantis anterior cervical plate was then placed over the C3 and C4 levels.  This then attached to fluoroscopic guidance using 13 mm variable angle screws to each at both levels.  Locking screws were engaged.  Final images revealed good position of the cage and the hardware at the proper operative level with normal alignment of spine.  Wound was then irrigated.  Hemostasis was assured.  Wound was then closed in layers with Vicryl sutures.  Steri-Strips and sterile dressing were applied.  No apparent complications.  Patient tolerated the procedure well and he returns to the recovery room postop.

## 2023-03-01 ENCOUNTER — Encounter (HOSPITAL_COMMUNITY): Payer: Self-pay | Admitting: Neurosurgery

## 2023-03-01 DIAGNOSIS — M5001 Cervical disc disorder with myelopathy,  high cervical region: Secondary | ICD-10-CM | POA: Diagnosis not present

## 2023-03-01 MED ORDER — OXYCODONE HCL 5 MG PO TABS: 5.0000 mg | ORAL_TABLET | Freq: Four times a day (QID) | ORAL | 0 refills | Status: AC | PRN

## 2023-03-01 MED FILL — Thrombin For Soln 20000 Unit: CUTANEOUS | Qty: 1 | Status: CN

## 2023-03-01 NOTE — Discharge Summary (Signed)
Physician Discharge Summary  Patient ID: Ricky Cook MRN: [id number] DOB/AGE: [birth year] 37 y.o.  Admit date: 02/28/2023 Discharge date: 03/01/2023  Admission Diagnoses:  Discharge Diagnoses:  Principal Problem:   Cervical myelopathy [facility])   Discharged Condition: good  Hospital Course: Patient admitted to the hospital where he underwent uncomplicated [id number] anterior cervical discectomy and fusion.  Postoperatively doing well.  Preoperative neck and upper extremity pain numbness and weakness all much improved.  Standing ambulating and voiding without difficulty.  Ready for discharge home.  Consults:   Significant Diagnostic Studies:   Treatments:   Discharge Exam: Blood pressure (!) 160/91, pulse 88, temperature (!) 97.5 F (36.4 C), temperature source Oral, resp. rate 18, height 5' 11.5" (1.816 m), weight 119.7 kg, SpO2 100 %. Alert.  Oriented and appropriate.  Motor and sensory function intact.  Wound clean and dry.  Chest and abdomen benign.  Disposition: Discharge disposition: 01-Home or Self Care        Allergies as of 03/01/2023       Reactions   Ceftin [cefuroxime Axetil] Shortness Of Breath   Augmentin [amoxicillin-pot Clavulanate] Other (See Comments)   thrush   Hydrocodone Hives, Other (See Comments)   constipation   Sulfa Antibiotics Other (See Comments)   Thrush        Medication List     TAKE these medications    albuterol 108 (90 Base) MCG/ACT inhaler Commonly known as: VENTOLIN HFA Inhale 1-2 puffs into the lungs every 6 (six) hours as needed for wheezing or shortness of breath.   amLODipine 10 MG tablet Commonly known as: NORVASC Take 10 mg by mouth in the morning.   AVEENO ECZEMA THERAPY EX Apply 1 Application topically daily. Applied to face daily   cetirizine 10 MG tablet Commonly known as: ZYRTEC Take 10 mg by mouth in the morning.   cyclobenzaprine 10 MG tablet Commonly known as: FLEXERIL Take 10 mg by mouth 3 (three) times  daily as needed for muscle spasms.   famotidine 20 MG tablet Commonly known as: PEPCID Take 20 mg by mouth in the morning.   gabapentin 100 MG capsule Commonly known as: NEURONTIN Take 100 mg by mouth 3 (three) times daily.   hydrALAZINE 25 MG tablet Commonly known as: APRESOLINE Take 25 mg by mouth 2 (two) times daily.   losartan 100 MG tablet Commonly known as: COZAAR Take 100 mg by mouth in the morning.   methylPREDNISolone 4 MG Tbpk tablet Commonly known as: MEDROL DOSEPAK Take 4-24 mg by mouth See admin instructions. Take these number of tablets on consecutive days:6-5-4-3-2-1   omega-3 acid ethyl esters 1 g capsule Commonly known as: LOVAZA Take 1 g by mouth in the morning, at noon, and at bedtime.   oxyCODONE 5 MG immediate release tablet Commonly known as: Oxy IR/ROXICODONE Take 1 tablet (5 mg total) by mouth every 6 (six) hours as needed (pain.).   oxyCODONE-acetaminophen 5-325 MG tablet Commonly known as: Percocet Take 1-2 tablets by mouth every 6 (six) hours as needed for severe pain.   STOPAIN EX Apply 1 Application topically 3 (three) times daily as needed (pain.). Maximum performance pain relief topical         Signed: Charlie Pitter 03/01/2023, 9:55 AM

## 2023-03-01 NOTE — Discharge Instructions (Signed)

## 2023-03-01 NOTE — Evaluation (Signed)
Occupational Therapy Evaluation Patient Details Name: Ricky Cook MRN: [id number] DOB: Jan 06, 1986 Today's Date: 03/01/2023   History of Present Illness Ricky Cook is a 37 yo male who underwent Anterior Cervical Decompression Fusion - Cervical three-Cervical four (Spine Cervical). PMHx: ADHD, Asthma, GERD, HTN   Clinical Impression   [place] was evaluated s/p the above spine surgery. He is generally indep at baseline. Upon evaluation he was limited by expected pain, cervical precautions, decreased activity tolerance and self-distracting behaviors/verbose. Overall he required generalized superivsion A for all mobility and ADL with cues. Provided cues and education on spinal precautions and compensatory techniques throughout, handout provided and pt demonstrated good recall. Pt does not require further acute OT services. Recommend d/c home with support of family.        Recommendations for follow up therapy are one component of a multi-disciplinary discharge planning process, led by the attending physician.  Recommendations may be updated based on patient status, additional functional criteria and insurance authorization.   Follow Up Recommendations  No OT follow up     Assistance Recommended at Discharge PRN  Patient can return home with the following A little help with walking and/or transfers;A little help with bathing/dressing/bathroom;Assist for transportation    Functional Status Assessment  Patient has had a recent decline in their functional status and demonstrates the ability to make significant improvements in function in a reasonable and predictable amount of time.  Equipment Recommendations  None recommended by OT       Precautions / Restrictions Precautions Precautions: Fall Restrictions Weight Bearing Restrictions: No      Mobility Bed Mobility Overal bed mobility: Needs Assistance Bed Mobility: Rolling, Sidelying to Sit Rolling: Supervision Sidelying to  sit: Supervision            Transfers Overall transfer level: Modified independent Equipment used: None                      Balance Overall balance assessment: No apparent balance deficits (not formally assessed)                                         ADL either performed or assessed with clinical judgement   ADL Overall ADL's : Needs assistance/impaired                                       General ADL Comments: generalized supervision A for safety, cues for cervical precautions     Vision Baseline Vision/History: 0 No visual deficits Vision Assessment?: No apparent visual deficits     Perception Perception Perception Tested?: No   Praxis Praxis Praxis tested?: Within functional limits      Hand Dominance Right   Extremity/Trunk Assessment Upper Extremity Assessment Upper Extremity Assessment: Overall WFL for tasks assessed   Lower Extremity Assessment Lower Extremity Assessment: Overall WFL for tasks assessed   Cervical / Trunk Assessment Cervical / Trunk Assessment: Neck Surgery   Communication Communication Communication: No difficulties   Cognition Arousal/Alertness: Awake/alert Behavior During Therapy: Anxious Overall Cognitive Status: Within Functional Limits for tasks assessed                                 General Comments: Self distracting adn verbose,  requires cues for redirection     General Comments  VSS on RA     Home Living Family/patient expects to be discharged to:: Private residence Living Arrangements: Parent Available Help at Discharge: Family Type of Home: House Home Access: Stairs to enter Technical brewer of Steps: 4 Entrance Stairs-Rails: Right;Left Home Layout: One level     Bathroom Shower/Tub: Teacher, early years/pre: Standard Bathroom Accessibility: Yes   Home Equipment: None          Prior Functioning/Environment Prior Level of  Function : Independent/Modified Independent             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 ADLs Comments: indep        OT Problem List: Decreased strength;Decreased activity tolerance;Decreased range of motion;Decreased safety awareness;Decreased knowledge of precautions         OT Goals(Current goals can be found in the care plan section) Acute Rehab OT Goals Patient Stated Goal: home OT Goal Formulation: With patient   AM-PAC OT "6 Clicks" Daily Activity     Outcome Measure Help from another person eating meals?: None Help from another person taking care of personal grooming?: A Little Help from another person toileting, which includes using toliet, bedpan, or urinal?: A Little Help from another person bathing (including washing, rinsing, drying)?: A Little Help from another person to put on and taking off regular upper body clothing?: None Help from another person to put on and taking off regular lower body clothing?: A Little 6 Click Score: 20   End of Session Equipment Utilized During Treatment: Cervical collar Nurse Communication: Mobility status  Activity Tolerance: Patient tolerated treatment well Patient left: in bed  OT Visit Diagnosis: Unsteadiness on feet (R26.81);Other abnormalities of gait and mobility (R26.89);Muscle weakness (generalized) (M62.81)                Time: [id number] OT Time Calculation (min): 20 min Charges:  OT General Charges $OT Visit: 1 Visit OT Evaluation $OT Eval Moderate Complexity: 1 Mod  Shade Flood, OTR/L [place] [employer] Communication Preferred   Elliot Cousin 03/01/2023, 10:05 AM

## 2023-03-01 NOTE — Plan of Care (Signed)
Pt doing well. Pt given D/C instructions with verbal understanding. Rx was given to the Pt at D/C. Pt's incision is clean and dry with no sign of infection. Pt's IV was removed prior to D/C. Pt D/C'd home via wheelchair per MD order. Pt is stable @ D/C and has no other needs at this time. Holli Humbles, RN

## 2023-03-28 ENCOUNTER — Ambulatory Visit: Payer: Medicaid Other | Admitting: Orthopaedic Surgery

## 2023-04-04 DIAGNOSIS — Z6839 Body mass index (BMI) 39.0-39.9, adult: Secondary | ICD-10-CM | POA: Diagnosis not present

## 2023-04-04 DIAGNOSIS — M5020 Other cervical disc displacement, unspecified cervical region: Secondary | ICD-10-CM | POA: Diagnosis not present

## 2023-04-18 DIAGNOSIS — Z6838 Body mass index (BMI) 38.0-38.9, adult: Secondary | ICD-10-CM | POA: Diagnosis not present

## 2023-04-18 DIAGNOSIS — I10 Essential (primary) hypertension: Secondary | ICD-10-CM | POA: Diagnosis not present

## 2023-04-18 DIAGNOSIS — L0291 Cutaneous abscess, unspecified: Secondary | ICD-10-CM | POA: Diagnosis not present

## 2023-06-15 DIAGNOSIS — M5020 Other cervical disc displacement, unspecified cervical region: Secondary | ICD-10-CM | POA: Diagnosis not present

## 2023-09-12 ENCOUNTER — Emergency Department (HOSPITAL_COMMUNITY)
Admission: EM | Admit: 2023-09-12 | Discharge: 2023-09-13 | Disposition: A | Discharge status: Home / Self Care | Payer: Commercial Managed Care - HMO | Attending: Emergency Medicine | Admitting: Emergency Medicine

## 2023-09-12 ENCOUNTER — Emergency Department (HOSPITAL_COMMUNITY): Payer: Commercial Managed Care - HMO

## 2023-09-12 ENCOUNTER — Encounter (HOSPITAL_COMMUNITY): Payer: Self-pay | Admitting: Emergency Medicine

## 2023-09-12 ENCOUNTER — Other Ambulatory Visit: Payer: Self-pay

## 2023-09-12 DIAGNOSIS — I10 Essential (primary) hypertension: Secondary | ICD-10-CM | POA: Insufficient documentation

## 2023-09-12 DIAGNOSIS — M79642 Pain in left hand: Secondary | ICD-10-CM | POA: Insufficient documentation

## 2023-09-12 DIAGNOSIS — Z79899 Other long term (current) drug therapy: Secondary | ICD-10-CM | POA: Diagnosis not present

## 2023-09-12 MED ORDER — MELOXICAM 15 MG PO TABS
15.0000 mg | ORAL_TABLET | Freq: Once | ORAL | Status: AC
  Administered 2023-09-13: 15 mg via ORAL
  Filled 2023-09-12: qty 2
  Filled 2023-09-12: qty 1

## 2023-09-12 MED ORDER — OXYCODONE HCL 5 MG PO TABS
10.0000 mg | ORAL_TABLET | Freq: Once | ORAL | Status: AC
  Administered 2023-09-13: 10 mg via ORAL
  Filled 2023-09-12: qty 2

## 2023-09-12 MED ORDER — MELOXICAM 15 MG PO TABS: 15.0000 mg | ORAL_TABLET | Freq: Every day | ORAL | 0 refills | Status: AC

## 2023-09-12 NOTE — ED Triage Notes (Addendum)
Pt arrives POV c/o left hand pain for the past 3 days. Pt denies any injury.

## 2023-09-12 NOTE — ED Provider Notes (Signed)
[name]   CSN: [id number] Arrival date & time: 09/12/23  2211     History  Chief Complaint  Patient presents with   Hand Injury    Ricky Cook is a 37 y.o. male.  37 year old male with history of hypertension who presents to the ER today secondary to atraumatic left hand pain.  Patient states he just started a few days ago.  He has been taking Tylenol without relief.  No known injuries.  No recent illnesses.  No pain or symptoms elsewhere.  States it hurts to try to flex his left thumb.  States it hurts mostly around the MCP joint.  No swelling.  No history of gout or other arthritis.  He does have multiple surgeries in the past for different bony issues but they were all traumatic.  States has been trying to Tylenol but does not help many things is why his blood pressure is higher than normal.  Has no symptoms related to the blood pressure such as chest pain, blurry vision, headache, shortness of breath.   Hand Injury      Home Medications Prior to Admission medications   Medication Sig Start Date End Date Taking? Authorizing Provider  meloxicam (MOBIC) 15 MG tablet Take 1 tablet (15 mg total) by mouth daily for 10 days. 09/12/23 09/22/23 Yes Roanne Haye, Barbara Cower, MD  albuterol (VENTOLIN HFA) 108 (90 Base) MCG/ACT inhaler Inhale 1-2 puffs into the lungs every 6 (six) hours as needed for wheezing or shortness of breath.    [provider]  amLODipine (NORVASC) 10 MG tablet Take 10 mg by mouth in the morning. 10/05/21   [provider]  cetirizine (ZYRTEC) 10 MG tablet Take 10 mg by mouth in the morning.    [provider]  Colloidal Oatmeal (AVEENO ECZEMA THERAPY EX) Apply 1 Application topically daily. Applied to face daily    [provider]  cyclobenzaprine (FLEXERIL) 10 MG tablet Take 10 mg by mouth 3 (three) times daily as needed for muscle spasms. 02/13/23   [provider]   famotidine (PEPCID) 20 MG tablet Take 20 mg by mouth in the morning. 10/05/21   [provider]  gabapentin (NEURONTIN) 100 MG capsule Take 100 mg by mouth 3 (three) times daily. 12/15/22   [provider]  hydrALAZINE (APRESOLINE) 25 MG tablet Take 25 mg by mouth 2 (two) times daily.    [provider]  losartan (COZAAR) 100 MG tablet Take 100 mg by mouth in the morning. 10/05/21   [provider]  Menthol, Topical Analgesic, (STOPAIN EX) Apply 1 Application topically 3 (three) times daily as needed (pain.). Maximum performance pain relief topical    [provider]  methylPREDNISolone (MEDROL DOSEPAK) 4 MG TBPK tablet Take 4-24 mg by mouth See admin instructions. Take these number of tablets on consecutive days:6-5-4-3-2-1 02/02/23   [provider]  omega-3 acid ethyl esters (LOVAZA) 1 g capsule Take 1 g by mouth in the morning, at noon, and at bedtime.    [provider]  oxyCODONE (OXY IR/ROXICODONE) 5 MG immediate release tablet Take 1 tablet (5 mg total) by mouth every 6 (six) hours as needed (pain.). 03/01/23   Julio Sicks, MD  oxyCODONE-acetaminophen (PERCOCET) 5-325 MG tablet Take 1-2 tablets by mouth every 6 (six) hours as needed for severe pain. Patient not taking: Reported on 02/20/2023 11/21/22 11/21/23  Eldred Manges, MD      Allergies  Ceftin [cefuroxime axetil], Augmentin [amoxicillin-pot clavulanate], Hydrocodone, and Sulfa antibiotics    Review of Systems   Review of Systems  Physical Exam Updated Vital Signs BP (!) 186/117 (BP Location: Right Arm)   Pulse 72   Temp 99.7 F (37.6 C) (Oral)   Resp 18   Ht 5\' 11"  (1.803 m)   Wt 119.7 kg   SpO2 99%   BMI 36.81 kg/m  Physical Exam Vitals and nursing note reviewed.  Constitutional:      Appearance: He is well-developed.  HENT:     Head: Normocephalic and atraumatic.  Eyes:     Pupils: Pupils are equal, round, and reactive to light.  Cardiovascular:      Rate and Rhythm: Normal rate.  Pulmonary:     Effort: Pulmonary effort is normal. No respiratory distress.  Abdominal:     General: There is no distension.  Musculoskeletal:        General: Normal range of motion.     Cervical back: Normal range of motion.     Comments: Tenderness over thenar eminence. Also hurts with squeezing his brachioradialis. No edema or erythema.  Skin:    General: Skin is warm and dry.  Neurological:     General: No focal deficit present.     Mental Status: He is alert.    ED Results / Procedures / Treatments   Labs (all labs ordered are listed, but only abnormal results are displayed) Labs Reviewed - No data to display  EKG None  Radiology DG Hand Complete Left  Result Date: 09/12/2023 CLINICAL DATA:  Left hand pain EXAM: LEFT HAND - COMPLETE 3+ VIEW COMPARISON:  None Available. FINDINGS: There is no evidence of fracture or dislocation. There is no evidence of arthropathy or other focal bone abnormality. Soft tissues are unremarkable. IMPRESSION: Negative. Electronically Signed   By: Helyn Numbers M.D.   On: 09/12/2023 22:50    Procedures Procedures    Medications Ordered in ED Medications  oxyCODONE (Oxy IR/ROXICODONE) immediate release tablet 10 mg (has no administration in time range)  meloxicam (MOBIC) tablet 15 mg (has no administration in time range)    ED Course/ Medical Decision Making/ A&P                                 Medical Decision Making Amount and/or Complexity of Data Reviewed Radiology: ordered.  Risk Prescription drug management.   Mild tenderness over the thenar eminence.  Has full range of motion and is neurologically intact.  He can flex the thumb but is limited secondary to pain.  X-ray viewed interpreted by myself without any obvious fractures.  Does have signs of arthritis, no evidence of effusion or concern for septic arthritis or gouty arthritis at this time.  Will treat symptomatically with immobilization for  comfort and PCP follow-up if not improving in a week.  Final Clinical Impression(s) / ED Diagnoses Final diagnoses:  Pain of left hand  Hypertension, unspecified type    Rx / DC Orders ED Discharge Orders          Ordered    meloxicam (MOBIC) 15 MG tablet  Daily        09/12/23 2332              Haston Casebolt, Barbara Cower, MD 09/13/23 [phone]

## 2023-09-12 NOTE — ED Provider Notes (Incomplete)
[place] EMERGENCY DEPARTMENT AT [facility] Provider Note   CSN: [id number] Arrival date & time: 09/12/23  2211     History  Chief Complaint  Patient presents with  . Hand Injury    Ricky Cook is a 37 y.o. male.  37 year old male with history of hypertension who presents to the ER today secondary to atraumatic left hand pain.  Patient states he just started a few days ago.  He has been taking Tylenol without relief.  No known injuries.  No recent illnesses.  No pain or symptoms elsewhere.  States it hurts to try to flex his left thumb.  States it hurts mostly around the MCP joint.  No swelling.  No history of gout or other arthritis.  He does have multiple surgeries in the past for different bony issues but they were all traumatic.  States has been trying to Tylenol but does not help many things is why his blood pressure is higher than normal.  Has no symptoms related to the blood pressure such as chest pain, blurry vision, headache, shortness of breath.   Hand Injury      Home Medications Prior to Admission medications   Medication Sig Start Date End Date Taking? Authorizing Provider  meloxicam (MOBIC) 15 MG tablet Take 1 tablet (15 mg total) by mouth daily for 10 days. 09/12/23 09/22/23 Yes Haider Hornaday, Barbara Cower, MD  albuterol (VENTOLIN HFA) 108 (90 Base) MCG/ACT inhaler Inhale 1-2 puffs into the lungs every 6 (six) hours as needed for wheezing or shortness of breath.    [provider]  amLODipine (NORVASC) 10 MG tablet Take 10 mg by mouth in the morning. 10/05/21   [provider]  cetirizine (ZYRTEC) 10 MG tablet Take 10 mg by mouth in the morning.    [provider]  Colloidal Oatmeal (AVEENO ECZEMA THERAPY EX) Apply 1 Application topically daily. Applied to face daily    [provider]  cyclobenzaprine (FLEXERIL) 10 MG tablet Take 10 mg by mouth 3 (three) times daily as needed for muscle spasms. 02/13/23   [provider]   famotidine (PEPCID) 20 MG tablet Take 20 mg by mouth in the morning. 10/05/21   [provider]  gabapentin (NEURONTIN) 100 MG capsule Take 100 mg by mouth 3 (three) times daily. 12/15/22   [provider]  hydrALAZINE (APRESOLINE) 25 MG tablet Take 25 mg by mouth 2 (two) times daily.    [provider]  losartan (COZAAR) 100 MG tablet Take 100 mg by mouth in the morning. 10/05/21   [provider]  Menthol, Topical Analgesic, (STOPAIN EX) Apply 1 Application topically 3 (three) times daily as needed (pain.). Maximum performance pain relief topical    [provider]  methylPREDNISolone (MEDROL DOSEPAK) 4 MG TBPK tablet Take 4-24 mg by mouth See admin instructions. Take these number of tablets on consecutive days:6-5-4-3-2-1 02/02/23   [provider]  omega-3 acid ethyl esters (LOVAZA) 1 g capsule Take 1 g by mouth in the morning, at noon, and at bedtime.    [provider]  oxyCODONE (OXY IR/ROXICODONE) 5 MG immediate release tablet Take 1 tablet (5 mg total) by mouth every 6 (six) hours as needed (pain.). 03/01/23   Julio Sicks, MD  oxyCODONE-acetaminophen (PERCOCET) 5-325 MG tablet Take 1-2 tablets by mouth every 6 (six) hours as needed for severe pain. Patient not taking: Reported on 02/20/2023 11/21/22 11/21/23  Eldred Manges, MD      Allergies  Ceftin [cefuroxime axetil], Augmentin [amoxicillin-pot clavulanate], Hydrocodone, and Sulfa antibiotics    Review of Systems   Review of Systems  Physical Exam Updated Vital Signs BP (!) 186/117 (BP Location: Right Arm)   Pulse 72   Temp 99.7 F (37.6 C) (Oral)   Resp 18   Ht 5\' 11"  (1.803 m)   Wt 119.7 kg   SpO2 99%   BMI 36.81 kg/m  Physical Exam Vitals and nursing note reviewed.  Constitutional:      Appearance: He is well-developed.  HENT:     Head: Normocephalic and atraumatic.  Eyes:     Pupils: Pupils are equal, round, and reactive to light.  Cardiovascular:      Rate and Rhythm: Normal rate.  Pulmonary:     Effort: Pulmonary effort is normal. No respiratory distress.  Abdominal:     General: There is no distension.  Musculoskeletal:        General: Normal range of motion.     Cervical back: Normal range of motion.     Comments: Tenderness over thenar eminence. Also hurts with squeezing his brachioradialis. No edema or erythema.  Skin:    General: Skin is warm and dry.  Neurological:     General: No focal deficit present.     Mental Status: He is alert.     ED Results / Procedures / Treatments   Labs (all labs ordered are listed, but only abnormal results are displayed) Labs Reviewed - No data to display  EKG None  Radiology DG Hand Complete Left  Result Date: 09/12/2023 CLINICAL DATA:  Left hand pain EXAM: LEFT HAND - COMPLETE 3+ VIEW COMPARISON:  None Available. FINDINGS: There is no evidence of fracture or dislocation. There is no evidence of arthropathy or other focal bone abnormality. Soft tissues are unremarkable. IMPRESSION: Negative. Electronically Signed   By: Helyn Numbers M.D.   On: 09/12/2023 22:50    Procedures Procedures    Medications Ordered in ED Medications  oxyCODONE (Oxy IR/ROXICODONE) immediate release tablet 10 mg (has no administration in time range)  meloxicam (MOBIC) tablet 15 mg (has no administration in time range)    ED Course/ Medical Decision Making/ A&P                                 Medical Decision Making Amount and/or Complexity of Data Reviewed Radiology: ordered.  Risk Prescription drug management.   Mild tenderness over the thenar eminence.  Has full range of motion and is neurologically intact.  He can flex the thumb but is limited secondary to pain.  X-ray viewed interpreted by myself without any obvious fractures.  Does have signs of arthritis, no evidence of effusion or concern for septic arthritis or gouty arthritis at this time.  Will treat symptomatically with immobilization for  comfort and PCP follow-up if not improving in a week.  Final Clinical Impression(s) / ED Diagnoses Final diagnoses:  Pain of left hand  Hypertension, unspecified type    Rx / DC Orders ED Discharge Orders          Ordered    meloxicam (MOBIC) 15 MG tablet  Daily        09/12/23 2332

## 2023-09-13 DIAGNOSIS — M79642 Pain in left hand: Secondary | ICD-10-CM | POA: Diagnosis not present

## 2024-02-28 ENCOUNTER — Ambulatory Visit (INDEPENDENT_AMBULATORY_CARE_PROVIDER_SITE_OTHER): Admitting: Orthopaedic Surgery

## 2024-02-28 ENCOUNTER — Other Ambulatory Visit: Payer: Self-pay

## 2024-02-28 ENCOUNTER — Encounter: Payer: Self-pay | Admitting: Orthopaedic Surgery

## 2024-02-28 VITALS — Ht 73.0 in | Wt 235.0 lb

## 2024-02-28 DIAGNOSIS — M25562 Pain in left knee: Secondary | ICD-10-CM | POA: Diagnosis not present

## 2024-02-28 NOTE — Progress Notes (Signed)
 Office Visit Note   Patient: Ricky Cook           Date of Birth: Jan 11, 1986           MRN: [id number] Visit Date: 02/28/2024              Requested by: Toma Deiters, MD [address] [place],  [place] [address] PCP: Toma Deiters, MD   Assessment & Plan: Visit Diagnoses:  1. Acute pain of left knee     Plan: Patient with fall and left knee can feet contusion.  He is having trouble walking and is not able to resume work at this time work slip given no work x 1 week.  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  He can continue ice elevation and ibuprofen.  Follow-up if he is having ongoing symptoms.  Follow-Up Instructions: No follow-ups on file.   Orders:  Orders Placed This Encounter  Procedures   XR KNEE 3 VIEW LEFT   No orders of the defined types were placed in this encounter.     Procedures: No procedures performed   Clinical Data: No additional findings.   Subjective: Chief Complaint  Patient presents with   Left Knee - Pain    HPI 38 year old male seen for acute injury when he fell 3 to 4 days ago at home landing on concrete on his left knee.  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  No surgery date was 11/14/2022.  After patient fell he has pain medially along the inferior medial border of the patella and medial tibial tubercle and states that sharp pain with ambulating has prevented him from working he works in [employer] he states it feels exactly like the knee pain he had when he had to have surgery back in 2023.  Additionally patient's had cervical fusion followed by repeat exploration for evacuation of hematoma and then another level fused later 2024 . Patient did not have any ecchymosis from the fall ,no laceration.  Patient has been on ibuprofen but states is not  helping the pain is also used ice. Review of Systems all other systems noncontributory to HPI if she were fever or chills.   Objective: Vital Signs: Ht 6\' 1"  (1.854 m)   Wt 235 lb (106.6 kg)   BMI 31.00 kg/m   Physical Exam Constitutional:      Appearance: He is well-developed.  HENT:     Head: Normocephalic and atraumatic.     Right Ear: External ear normal.     Left Ear: External ear normal.  Eyes:     Pupils: Pupils are equal, round, and reactive to light.  Neck:     Thyroid: No thyromegaly.     Trachea: No tracheal deviation.  Cardiovascular:     Rate and Rhythm: Normal rate.  Pulmonary:     Effort: Pulmonary effort is normal.     Breath sounds: No wheezing.  Abdominal:     General: Bowel sounds are normal.     Palpations: Abdomen is soft.  Musculoskeletal:     Cervical back: Neck supple.  Skin:    General: Skin is warm and dry.     Capillary Refill: Capillary refill takes less than 2 seconds.  Neurological:     Mental Status: He is alert and oriented to person, place, and time.  Psychiatric:  Behavior: Behavior normal.        Thought Content: Thought content normal.        Judgment: Judgment normal.     Ortho Exam significant tenderness over the inferior pole of the patella medially.  No medial or lateral joint line tenderness collateral ligaments are stable full extension he is ambulating with a limp.  No knee effusion right or left.  No ecchymosis no laceration no abrasions over his left knee.  Specialty Comments:  No specialty comments available.  Imaging: No results found.   PMFS History: Patient Active Problem List   Diagnosis Date Noted   Cervical myelopathy (HCC) 02/28/2023   Trochanteric bursitis, left hip 01/19/2023   Left anterior knee pain 10/27/2022   Hematoma 12/28/2021   Surgery, elective 12/22/2021   Respiratory failure (HCC)    Hematoma of neck    Incidental finding of COVID-19 virus infection    Cervical spondylosis with  myelopathy and radiculopathy 12/21/2021   Past Medical History:  Diagnosis Date   ADHD (attention deficit hyperactivity disorder)    Asthma    GERD (gastroesophageal reflux disease)    Hypertension     Family History  Problem Relation Age of Onset   Hypertension Mother    Diabetes Mother    Hypercholesterolemia Father     Past Surgical History:  Procedure Laterality Date   ANTERIOR CERVICAL DECOMP/DISCECTOMY FUSION N/A 12/21/2021   Procedure: ANTERIOR CERVICAL WOUND EXPLORATION WITH EVACUATION OF HEMATOMA;  Surgeon: Lisbeth Renshaw, MD;  Location: [facility] OR;  Service: Neurosurgery;  Laterality: N/A;   ANTERIOR CERVICAL DECOMP/DISCECTOMY FUSION N/A 12/21/2021   Procedure: Anterior Cervical Decompression Fusion  - Cervical four-Cervical five - Cervical five-Cervical six;  Surgeon: Julio Sicks, MD;  Location: [facility] OR;  Service: Neurosurgery;  Laterality: N/A;   ANTERIOR CERVICAL DECOMP/DISCECTOMY FUSION N/A 02/28/2023   Procedure: Anterior Cervical Decompression Fusion - Cervical three-Cervical four;  Surgeon: Julio Sicks, MD;  Location: [facility] OR;  Service: Neurosurgery;  Laterality: N/A;   KNEE ARTHROSCOPY Left 11/14/2022   Procedure: LEFT KNEE ARTHROSCOPY, PARTIAL SYNOVECTOMY;  Surgeon: Eldred Manges, MD;  Location: [facility];  Service: Orthopedics;  Laterality: Left;   TONSILLECTOMY     Social History   Occupational History   Not on file  Tobacco Use   Smoking status: Former    Current packs/day: 0.00    Types: Cigarettes    Quit date: 02/01/2023    Years since quitting: 1.0   Smokeless tobacco: Never  Vaping Use   Vaping status: Never Used  Substance and Sexual Activity   Alcohol use: Never   Drug use: Not Currently   Sexual activity: Not on file

## 2024-09-09 ENCOUNTER — Emergency Department (HOSPITAL_COMMUNITY)

## 2024-09-09 ENCOUNTER — Encounter (HOSPITAL_COMMUNITY): Payer: Self-pay | Admitting: *Deleted

## 2024-09-09 ENCOUNTER — Encounter (HOSPITAL_COMMUNITY): Payer: Self-pay | Admitting: Emergency Medicine

## 2024-09-09 ENCOUNTER — Emergency Department (HOSPITAL_COMMUNITY)
Admission: EM | Admit: 2024-09-09 | Discharge: 2024-09-10 | Disposition: A | Discharge status: Home / Self Care | Attending: Emergency Medicine | Admitting: Emergency Medicine

## 2024-09-09 ENCOUNTER — Ambulatory Visit (HOSPITAL_COMMUNITY)
Admission: EM | Admit: 2024-09-09 | Discharge: 2024-09-09 | Disposition: A | Discharge status: Home / Self Care | Payer: Self-pay

## 2024-09-09 ENCOUNTER — Other Ambulatory Visit: Payer: Self-pay

## 2024-09-09 DIAGNOSIS — I10 Essential (primary) hypertension: Secondary | ICD-10-CM | POA: Diagnosis not present

## 2024-09-09 DIAGNOSIS — R42 Dizziness and giddiness: Secondary | ICD-10-CM | POA: Diagnosis present

## 2024-09-09 DIAGNOSIS — S0083XA Contusion of other part of head, initial encounter: Secondary | ICD-10-CM | POA: Insufficient documentation

## 2024-09-09 DIAGNOSIS — S39012A Strain of muscle, fascia and tendon of lower back, initial encounter: Secondary | ICD-10-CM | POA: Insufficient documentation

## 2024-09-09 DIAGNOSIS — Y92410 Unspecified street and highway as the place of occurrence of the external cause: Secondary | ICD-10-CM | POA: Diagnosis not present

## 2024-09-09 DIAGNOSIS — S161XXA Strain of muscle, fascia and tendon at neck level, initial encounter: Secondary | ICD-10-CM | POA: Insufficient documentation

## 2024-09-09 DIAGNOSIS — S0990XA Unspecified injury of head, initial encounter: Secondary | ICD-10-CM

## 2024-09-09 LAB — CBC WITH DIFFERENTIAL/PLATELET
Abs Immature Granulocytes: 0.01 K/uL (ref 0.00–0.07)
Basophils Absolute: 0.1 K/uL (ref 0.0–0.1)
Basophils Relative: 1 %
Eosinophils Absolute: 0.1 K/uL (ref 0.0–0.5)
Eosinophils Relative: 2 %
HCT: 44.1 % (ref 39.0–52.0)
Hemoglobin: 14.3 g/dL (ref 13.0–17.0)
Immature Granulocytes: 0 %
Lymphocytes Relative: 37 %
Lymphs Abs: 2 K/uL (ref 0.7–4.0)
MCH: 28.9 pg (ref 26.0–34.0)
MCHC: 32.4 g/dL (ref 30.0–36.0)
MCV: 89.3 fL (ref 80.0–100.0)
Monocytes Absolute: 0.4 K/uL (ref 0.1–1.0)
Monocytes Relative: 7 %
Neutro Abs: 2.9 K/uL (ref 1.7–7.7)
Neutrophils Relative %: 53 %
Platelets: 253 K/uL (ref 150–400)
RBC: 4.94 MIL/uL (ref 4.22–5.81)
RDW: 13.5 % (ref 11.5–15.5)
WBC: 5.4 K/uL (ref 4.0–10.5)
nRBC: 0 % (ref 0.0–0.2)

## 2024-09-09 LAB — BASIC METABOLIC PANEL WITH GFR
Anion gap: 11 (ref 5–15)
BUN: 11 mg/dL (ref 6–20)
CO2: 23 mmol/L (ref 22–32)
Calcium: 9 mg/dL (ref 8.9–10.3)
Chloride: 106 mmol/L (ref 98–111)
Creatinine, Ser: 1.47 mg/dL — ABNORMAL HIGH (ref 0.61–1.24)
GFR, Estimated: >60 mL/min (ref >60)
Glucose, Bld: 96 mg/dL (ref 70–99)
Potassium: 3.8 mmol/L (ref 3.5–5.1)
Sodium: 140 mmol/L (ref 135–145)

## 2024-09-09 LAB — TROPONIN I (HIGH SENSITIVITY): Troponin I (High Sensitivity): 6 ng/L (ref <18)

## 2024-09-09 MED ORDER — CYCLOBENZAPRINE HCL 10 MG PO TABS
10.0000 mg | ORAL_TABLET | Freq: Once | ORAL | Status: AC
  Administered 2024-09-09: 10 mg via ORAL
  Filled 2024-09-09: qty 1

## 2024-09-09 MED ORDER — ACETAMINOPHEN 500 MG PO TABS
1000.0000 mg | ORAL_TABLET | Freq: Once | ORAL | Status: AC
Start: 2024-09-09 — End: 2024-09-09
  Administered 2024-09-09: 1000 mg via ORAL
  Filled 2024-09-09: qty 2
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
## 2024-09-09 NOTE — ED Notes (Signed)
 Patient to CT.
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
## 2024-09-10 LAB — TROPONIN I (HIGH SENSITIVITY): Troponin I (High Sensitivity): 9 ng/L (ref <18)

## 2024-09-10 MED ORDER — CYCLOBENZAPRINE HCL 10 MG PO TABS: 5.0000 mg | ORAL_TABLET | Freq: Every day | ORAL | 0 refills | Status: AC

## 2024-09-10 MED ORDER — LIDOCAINE 5 % EX PTCH: 1.0000 | MEDICATED_PATCH | CUTANEOUS | 0 refills | Status: AC
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
## 2024-10-03 ENCOUNTER — Ambulatory Visit (HOSPITAL_COMMUNITY): Admission: EM | Admit: 2024-10-03 | Discharge: 2024-10-03 | Disposition: A | Discharge status: Home / Self Care

## 2024-10-03 ENCOUNTER — Other Ambulatory Visit: Payer: Self-pay

## 2024-10-03 ENCOUNTER — Encounter (HOSPITAL_COMMUNITY): Payer: Self-pay | Admitting: Emergency Medicine

## 2024-10-03 DIAGNOSIS — H66002 Acute suppurative otitis media without spontaneous rupture of ear drum, left ear: Secondary | ICD-10-CM | POA: Diagnosis not present

## 2024-10-03 DIAGNOSIS — J069 Acute upper respiratory infection, unspecified: Secondary | ICD-10-CM

## 2024-10-03 LAB — POC SOFIA SARS ANTIGEN FIA: SARS Coronavirus 2 Ag: NEGATIVE

## 2024-10-03 MED ORDER — ACETAMINOPHEN 325 MG PO TABS
ORAL_TABLET | ORAL | Status: AC
  Filled 2024-10-03: qty 3

## 2024-10-03 MED ORDER — DOXYCYCLINE HYCLATE 100 MG PO CAPS: 100.0000 mg | ORAL_CAPSULE | Freq: Two times a day (BID) | ORAL | 0 refills | Status: AC

## 2024-10-03 MED ORDER — ACETAMINOPHEN 325 MG PO TABS
975.0000 mg | ORAL_TABLET | Freq: Once | ORAL | Status: AC
  Administered 2024-10-03: 975 mg via ORAL

## 2024-10-03 MED ORDER — KETOROLAC TROMETHAMINE 30 MG/ML IJ SOLN
INTRAMUSCULAR | Status: AC
  Filled 2024-10-03: qty 1

## 2024-10-03 MED ORDER — KETOROLAC TROMETHAMINE 30 MG/ML IJ SOLN
30.0000 mg | Freq: Once | INTRAMUSCULAR | Status: AC
Start: 2024-10-03 — End: 2024-10-03
  Administered 2024-10-03: 30 mg via INTRAMUSCULAR

## 2024-10-03 NOTE — ED Provider Notes (Signed)
 [facility]    CSN: [id number] Arrival date & time: 10/03/24  1254      History   Chief Complaint No chief complaint on file.   HPI Ricky Cook is a 38 y.o. male.   Ricky Cook is a 38 y.o. male presenting for chief complaint of cough, nasal congestion, left-sided facial pain, left ear pain, generalized fatigue, and chills that started yesterday on October 02, 2024.  Cough is minimally productive with clear mucus.  Left maxillary/preauricular facial pain is most significant symptom.  He is not experiencing any pain to the right ear but reports left ear pain.  Denies tinnitus, drainage from the ear, dizziness, fever at home, nausea, vomiting, one-sided facial weakness/numbness, facial droop, speech changes, visual disturbance, headache, and rash.  History of asthma.  He is a cigarette smoker.  He has not needed to use his albuterol  inhaler.  Denies shortness of breath and chest tightness.  Denies recent sick contacts with similar symptoms.  He is taking Mucinex with minimal relief of symptoms.  He has not had any Tylenol  or ibuprofen in the last 6 hours.     Past Medical History:  Diagnosis Date   ADHD (attention deficit hyperactivity disorder)    Asthma    GERD (gastroesophageal reflux disease)    Hypertension     Patient Active Problem List   Diagnosis Date Noted   Cervical myelopathy (HCC) 02/28/2023   Trochanteric bursitis, left hip 01/19/2023   Left anterior knee pain 10/27/2022   Hematoma 12/28/2021   Surgery, elective 12/22/2021   Respiratory failure (HCC)    Hematoma of neck    Incidental finding of COVID-19 virus infection    Cervical spondylosis with myelopathy and radiculopathy 12/21/2021    Past Surgical History:  Procedure Laterality Date   ANTERIOR CERVICAL DECOMP/DISCECTOMY FUSION N/A 12/21/2021   Procedure: ANTERIOR CERVICAL WOUND EXPLORATION WITH EVACUATION OF HEMATOMA;  Surgeon: Lanis Pupa, MD;  Location: [facility] OR;  Service:  Neurosurgery;  Laterality: N/A;   ANTERIOR CERVICAL DECOMP/DISCECTOMY FUSION N/A 12/21/2021   Procedure: Anterior Cervical Decompression Fusion  - Cervical four-Cervical five - Cervical five-Cervical six;  Surgeon: Louis Shove, MD;  Location: [facility];  Service: Neurosurgery;  Laterality: N/A;   ANTERIOR CERVICAL DECOMP/DISCECTOMY FUSION N/A 02/28/2023   Procedure: Anterior Cervical Decompression Fusion - Cervical three-Cervical four;  Surgeon: Louis Shove, MD;  Location: [facility] OR;  Service: Neurosurgery;  Laterality: N/A;   KNEE ARTHROSCOPY Left 11/14/2022   Procedure: LEFT KNEE ARTHROSCOPY, PARTIAL SYNOVECTOMY;  Surgeon: Barbarann Oneil BROCKS, MD;  Location: [facility];  Service: Orthopedics;  Laterality: Left;   TONSILLECTOMY         Home Medications    Prior to Admission medications   Medication Sig Start Date End Date Taking? Authorizing Provider  doxycycline (VIBRAMYCIN) 100 MG capsule Take 1 capsule (100 mg total) by mouth 2 (two) times daily for 7 days. 10/03/24 10/10/24 Yes Whitney Hillegass, Dorna HERO, FNP  fexofenadine (ALLEGRA) 180 MG tablet Take 180 mg by mouth daily.   Yes [provider]  albuterol  (VENTOLIN  HFA) 108 (90 Base) MCG/ACT inhaler Inhale 1-2 puffs into the lungs every 6 (six) hours as needed for wheezing or shortness of breath.    [provider]  amLODipine  (NORVASC ) 10 MG tablet Take 10 mg by mouth in the morning. 10/05/21   [provider]  cetirizine (ZYRTEC) 10 MG tablet Take 10 mg by mouth in the morning.    [provider]  Colloidal  Oatmeal (AVEENO ECZEMA THERAPY EX) Apply 1 Application topically daily. Applied to face daily    [provider]  cyclobenzaprine  (FLEXERIL ) 10 MG tablet Take 0.5-1 tablets (5-10 mg total) by mouth at bedtime. 09/10/24   Veta Palma, PA-C  famotidine  (PEPCID ) 20 MG tablet Take 20 mg by mouth in the morning. 10/05/21   [provider]  gabapentin  (NEURONTIN ) 100 MG capsule Take  100 mg by mouth 3 (three) times daily. 12/15/22   [provider]  hydrALAZINE  (APRESOLINE ) 25 MG tablet Take 25 mg by mouth 2 (two) times daily.    [provider]  lidocaine  (LIDODERM ) 5 % Place 1 patch onto the skin daily. Remove & Discard patch within 12 hours or as directed by MD 09/10/24   Veta Palma, PA-C  losartan  (COZAAR ) 100 MG tablet Take 100 mg by mouth in the morning. 10/05/21   [provider]  Menthol , Topical Analgesic, (STOPAIN EX) Apply 1 Application topically 3 (three) times daily as needed (pain.). Maximum performance pain relief topical    [provider]  methylPREDNISolone  (MEDROL  DOSEPAK) 4 MG TBPK tablet Take 4-24 mg by mouth See admin instructions. Take these number of tablets on consecutive days:6-5-4-3-2-1 02/02/23   [provider]  omega-3 acid ethyl esters (LOVAZA ) 1 g capsule Take 1 g by mouth in the morning, at noon, and at bedtime.    [provider]  oxyCODONE  (OXY IR/ROXICODONE ) 5 MG immediate release tablet Take 1 tablet (5 mg total) by mouth every 6 (six) hours as needed (pain.). 03/01/23   Louis Shove, MD    Family History Family History  Problem Relation Age of Onset   Hypertension Mother    Diabetes Mother    Hypercholesterolemia Father     Social History Social History   Tobacco Use   Smoking status: Former    Current packs/day: 0.00    Types: Cigarettes    Quit date: 02/01/2023    Years since quitting: 1.6   Smokeless tobacco: Never  Vaping Use   Vaping status: Never Used  Substance Use Topics   Alcohol use: Never   Drug use: Not Currently     Allergies   Ceftin [cefuroxime axetil], Augmentin [amoxicillin-pot clavulanate], Hydrocodone , Sulfa antibiotics, and Sulfamethoxazole-trimethoprim   Review of Systems Review of Systems Per HPI  Physical Exam Triage Vital Signs ED Triage Vitals  Encounter Vitals Group     BP 10/03/24 1353 (!) 155/99     Girls Systolic BP Percentile  --      Girls Diastolic BP Percentile --      Boys Systolic BP Percentile --      Boys Diastolic BP Percentile --      Pulse Rate 10/03/24 1353 79     Resp 10/03/24 1353 18     Temp 10/03/24 1353 99 F (37.2 C)     Temp Source 10/03/24 1353 Oral     SpO2 10/03/24 1353 97 %     Weight --      Height --      Head Circumference --      Peak Flow --      Pain Score 10/03/24 1350 10     Pain Loc --      Pain Education --      Exclude from Growth Chart --    No data found.  Updated Vital Signs BP (!) 155/99 (BP Location: Left Arm) Comment: has not had blood pressure medicines  Pulse 79   Temp 99  F (37.2 C) (Oral)   Resp 18   SpO2 97%   Visual Acuity Right Eye Distance:   Left Eye Distance:   Bilateral Distance:    Right Eye Near:   Left Eye Near:    Bilateral Near:     Physical Exam Vitals and nursing note reviewed.  Constitutional:      Appearance: He is ill-appearing. He is not toxic-appearing.  HENT:     Head: Normocephalic and atraumatic.     Jaw: There is normal jaw occlusion.     Right Ear: Hearing, tympanic membrane, ear canal and external ear normal.     Left Ear: Hearing, ear canal and external ear normal. Tenderness present. Tympanic membrane is erythematous and bulging. Tympanic membrane is not perforated.     Nose:     Right Sinus: No maxillary sinus tenderness or frontal sinus tenderness.     Left Sinus: Maxillary sinus tenderness present. No frontal sinus tenderness.     Mouth/Throat:     Lips: Pink.     Mouth: Mucous membranes are moist. No injury or oral lesions.     Dentition: Normal dentition.     Tongue: No lesions.     Pharynx: Oropharynx is clear. Uvula midline. No pharyngeal swelling, oropharyngeal exudate, posterior oropharyngeal erythema, uvula swelling or postnasal drip.     Tonsils: No tonsillar exudate.  Eyes:     General: Lids are normal. Vision grossly intact. Gaze aligned appropriately.     Extraocular Movements: Extraocular  movements intact.     Conjunctiva/sclera: Conjunctivae normal.  Neck:     Trachea: Trachea and phonation normal.  Cardiovascular:     Rate and Rhythm: Normal rate and regular rhythm.     Heart sounds: Normal heart sounds, S1 normal and S2 normal.  Pulmonary:     Effort: Pulmonary effort is normal. No respiratory distress.     Breath sounds: Normal breath sounds and air entry. No decreased breath sounds, wheezing, rhonchi or rales.     Comments: Speaking in full sentences without difficulty or increased respiratory effort. Musculoskeletal:     Cervical back: Neck supple.  Lymphadenopathy:     Cervical: No cervical adenopathy.  Skin:    General: Skin is warm and dry.     Capillary Refill: Capillary refill takes less than 2 seconds.     Findings: No rash.  Neurological:     General: No focal deficit present.     Mental Status: He is alert and oriented to person, place, and time. Mental status is at baseline.     GCS: GCS eye subscore is 4. GCS verbal subscore is 5. GCS motor subscore is 6.     Cranial Nerves: Cranial nerves 2-12 are intact. No dysarthria or facial asymmetry.     Sensory: Sensation is intact.     Motor: Motor function is intact. No weakness, tremor, abnormal muscle tone or pronator drift.     Coordination: Coordination is intact. Romberg sign negative. Coordination normal. Finger-Nose-Finger Test normal.     Gait: Gait is intact.     Comments: Strength and sensation intact to bilateral upper and lower extremities (5/5). Moves all 4 extremities with normal coordination voluntarily. Non-focal neuro exam.   Psychiatric:        Mood and Affect: Mood normal.        Speech: Speech normal.        Behavior: Behavior normal.        Thought Content: Thought content normal.  Judgment: Judgment normal.      UC Treatments / Results  Labs (all labs ordered are listed, but only abnormal results are displayed) Labs Reviewed  POC SOFIA SARS ANTIGEN FIA - Normal     EKG   Radiology No results found.  Procedures Procedures (including critical care time)  Medications Ordered in UC Medications  acetaminophen  (TYLENOL ) tablet 975 mg (975 mg Oral Given 10/03/24 1502)  ketorolac  (TORADOL ) 30 MG/ML injection 30 mg (30 mg Intramuscular Given 10/03/24 1502)    Initial Impression / Assessment and Plan / UC Course  I have reviewed the triage vital signs and the nursing notes.  Pertinent labs & imaging results that were available during my care of the patient were reviewed by me and considered in my medical decision making (see chart for details).   1. Viral URI with cough Suspect viral URI, viral syndrome.  Strep/viral testing: POC COVID-19 testing negative.   Physical exam findings reassuring, vital signs hemodynamically stable, and lungs clear, therefore deferred imaging of the chest.  Advised supportive care/prescriptions for symptomatic relief as outlined in AVS.    2. Acute suppurative otitis media of left ear Otitis media of left ear on exam. Eardrum intact.  Allergic to cephalosporins, sulfa, and penicillins.  Patient had thrush when he took Augmentin, he did not have rash. He does not want to try amoxicillin.  He is open to taking doxycycline.  Doxycycline BID for 7 days.  Tylenol /motrin PRN for pain.  Counseled patient on potential for adverse effects with medications prescribed/recommended today, strict ER and return-to-clinic precautions discussed, patient verbalized understanding.    Final Clinical Impressions(s) / UC Diagnoses   Final diagnoses:  Viral URI with cough  Acute suppurative otitis media of left ear without spontaneous rupture of tympanic membrane, recurrence not specified     Discharge Instructions      Take doxycycline every 12 hours for 7 days to treat left ear infection. COVID is negative, other symptoms are viral.  Continue guaifenesin (Mucinex).  Take over the counter medications as needed for cough.    We gave you a shot of ketorolac  in the clinic today which will help with your facial pain.  Do not take any ibuprofen/other NSAIDs for 24 hours since we gave you the shot of ketorolac  in the clinic today.  Please take Tylenol  1000 mg every 6 hours as needed for pain.  Use warm compresses to the left side of the face to reduce pain and swelling.  You may also use saline nasal sprays as needed to relieve sinus pressure.  If you develop any new or worsening symptoms or if your symptoms do not start to improve, please return here or follow-up with your primary care provider. If your symptoms are severe, please go to the emergency room.   I hope you feel better soon!     ED Prescriptions     Medication Sig Dispense Auth. Provider   doxycycline (VIBRAMYCIN) 100 MG capsule Take 1 capsule (100 mg total) by mouth 2 (two) times daily for 7 days. 14 capsule Enedelia Dorna HERO, FNP      PDMP not reviewed this encounter.   Enedelia Dorna HERO, [place] 10/03/24 [phone]

## 2024-10-03 NOTE — ED Triage Notes (Signed)
 Pain to left side of face.  Feels pressure to left side of face, chewing, talking makes pain worse.  Patient has taken allergy medicine. Pain started yesterday.  Patient reports he is coughing too. Patient reports clear secretions

## 2024-10-03 NOTE — Discharge Instructions (Signed)
 Take doxycycline every 12 hours for 7 days to treat left ear infection. COVID is negative, other symptoms are viral.  Continue guaifenesin (Mucinex).  Take over the counter medications as needed for cough.   We gave you a shot of ketorolac  in the clinic today which will help with your facial pain.  Do not take any ibuprofen/other NSAIDs for 24 hours since we gave you the shot of ketorolac  in the clinic today.  Please take Tylenol  1000 mg every 6 hours as needed for pain.  Use warm compresses to the left side of the face to reduce pain and swelling.  You may also use saline nasal sprays as needed to relieve sinus pressure.  If you develop any new or worsening symptoms or if your symptoms do not start to improve, please return here or follow-up with your primary care provider. If your symptoms are severe, please go to the emergency room.   I hope you feel better soon!

## 2024-10-10 ENCOUNTER — Telehealth: Payer: Self-pay | Admitting: Physician Assistant

## 2024-10-10 ENCOUNTER — Other Ambulatory Visit: Payer: Self-pay

## 2024-10-10 ENCOUNTER — Encounter: Admitting: Physician Assistant

## 2024-10-10 ENCOUNTER — Ambulatory Visit (INDEPENDENT_AMBULATORY_CARE_PROVIDER_SITE_OTHER): Admitting: Physician Assistant

## 2024-10-10 DIAGNOSIS — M79674 Pain in right toe(s): Secondary | ICD-10-CM | POA: Diagnosis not present

## 2024-10-10 DIAGNOSIS — S92401A Displaced unspecified fracture of right great toe, initial encounter for closed fracture: Secondary | ICD-10-CM

## 2024-10-10 NOTE — Telephone Encounter (Signed)
 Patient stated he needs the office notes from today and xray imaging so he can turn into his lawyer. He asked if he can pick it up tomorrow.

## 2024-10-10 NOTE — Progress Notes (Unsigned)
 Office Visit Note   Patient: Ricky Cook           Date of Birth: 09-10-1986           MRN: [id number] Visit Date: 10/10/2024              Requested by: Orpha Yancey LABOR, MD [address] [place],  [place] [address] PCP: Orpha Yancey LABOR, MD  No chief complaint on file.     HPI:   Assessment & Plan: Visit Diagnoses: No diagnosis found.  Plan: ***  Follow-Up Instructions: No follow-ups on file.   Ortho Exam  Patient is alert, oriented, no adenopathy, well-dressed, normal affect, normal respiratory effort. ***    Imaging: No results found. No images are attached to the encounter.  Labs: Lab Results  Component Value Date   REPTSTATUS 11/14/2020 FINAL 11/11/2020   CULT  11/11/2020    NO GROUP A STREP (S.PYOGENES) ISOLATED Performed at [facility], [address] [place], [place] [address]      Lab Results  Component Value Date   ALBUMIN 3.6 12/28/2021   ALBUMIN 4.4 05/28/2012   ALBUMIN 3.7 07/02/2008    Lab Results  Component Value Date   MG 1.9 12/22/2021   No results found for: VD25OH  No results found for: PREALBUMIN    Latest Ref Rng & Units 09/09/2024    9:54 PM 02/22/2023    1:22 PM 12/28/2021    2:57 AM  CBC EXTENDED  WBC 4.0 - 10.5 K/uL 5.4  6.5  12.5   RBC 4.22 - 5.81 MIL/uL 4.94  5.28  4.54   Hemoglobin 13.0 - 17.0 g/dL 85.6  85.3  87.0   HCT 39.0 - 52.0 % 44.1  45.5  40.8   Platelets 150 - 400 K/uL 253  262  391   NEUT# 1.7 - 7.7 K/uL 2.9   8.7   Lymph# 0.7 - 4.0 K/uL 2.0   2.9      There is no height or weight on file to calculate BMI.  Orders:  No orders of the defined types were placed in this encounter.  No orders of the defined types were placed in this encounter.    Procedures: No procedures performed  Clinical Data: No additional findings.  ROS:  All other systems negative, except as noted in the HPI. Review of Systems  Objective: Vital Signs: There were no vitals taken for this  visit.  Specialty Comments:  No specialty comments available.  PMFS History: Patient Active Problem List   Diagnosis Date Noted   Cervical myelopathy (HCC) 02/28/2023   Trochanteric bursitis, left hip 01/19/2023   Left anterior knee pain 10/27/2022   Hematoma 12/28/2021   Surgery, elective 12/22/2021   Respiratory failure (HCC)    Hematoma of neck    Incidental finding of COVID-19 virus infection    Cervical spondylosis with myelopathy and radiculopathy 12/21/2021   Past Medical History:  Diagnosis Date   ADHD (attention deficit hyperactivity disorder)    Asthma    GERD (gastroesophageal reflux disease)    Hypertension     Family History  Problem Relation Age of Onset   Hypertension Mother    Diabetes Mother    Hypercholesterolemia Father     Past Surgical History:  Procedure Laterality Date   ANTERIOR CERVICAL DECOMP/DISCECTOMY FUSION N/A 12/21/2021   Procedure: ANTERIOR CERVICAL WOUND EXPLORATION WITH EVACUATION OF HEMATOMA;  Surgeon: Lanis Pupa, MD;  Location: [facility] OR;  Service: Neurosurgery;  Laterality: N/A;   ANTERIOR CERVICAL DECOMP/DISCECTOMY FUSION N/A 12/21/2021   Procedure: Anterior Cervical Decompression Fusion  - Cervical four-Cervical five - Cervical five-Cervical six;  Surgeon: Louis Shove, MD;  Location: [facility] OR;  Service: Neurosurgery;  Laterality: N/A;   ANTERIOR CERVICAL DECOMP/DISCECTOMY FUSION N/A 02/28/2023   Procedure: Anterior Cervical Decompression Fusion - Cervical three-Cervical four;  Surgeon: Louis Shove, MD;  Location: [facility] OR;  Service: Neurosurgery;  Laterality: N/A;   KNEE ARTHROSCOPY Left 11/14/2022   Procedure: LEFT KNEE ARTHROSCOPY, PARTIAL SYNOVECTOMY;  Surgeon: Barbarann Oneil BROCKS, MD;  Location: [facility];  Service: Orthopedics;  Laterality: Left;   TONSILLECTOMY     Social History   Occupational History   Not on file  Tobacco Use   Smoking status: Former    Current packs/day: 0.00    Types: Cigarettes    Quit date:  02/01/2023    Years since quitting: 1.6   Smokeless tobacco: Never  Vaping Use   Vaping status: Never Used  Substance and Sexual Activity   Alcohol use: Never   Drug use: Not Currently   Sexual activity: Yes    Birth control/protection: None

## 2024-10-11 ENCOUNTER — Encounter: Payer: Self-pay | Admitting: Physician Assistant

## 2024-10-11 NOTE — Telephone Encounter (Signed)
 Dictation is pending and will notify pt when it is available for pick up.

## 2024-10-15 NOTE — Telephone Encounter (Signed)
 Could you please a burn a disc for this pt of his xrays and I will call to let him know when ready for pick up with his dictation please?

## 2024-10-16 NOTE — Telephone Encounter (Signed)
 I called pt to advise that note and xrays are ready for pick up.

## 2024-10-21 ENCOUNTER — Encounter: Payer: Self-pay | Admitting: Radiology

## 2024-11-22 ENCOUNTER — Ambulatory Visit: Attending: Student

## 2024-11-22 DIAGNOSIS — M6281 Muscle weakness (generalized): Secondary | ICD-10-CM | POA: Diagnosis present

## 2024-11-22 DIAGNOSIS — M542 Cervicalgia: Secondary | ICD-10-CM | POA: Diagnosis present

## 2024-11-22 DIAGNOSIS — M5412 Radiculopathy, cervical region: Secondary | ICD-10-CM | POA: Insufficient documentation
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
## 2024-11-23 ENCOUNTER — Other Ambulatory Visit: Payer: Self-pay

## 2024-11-28 ENCOUNTER — Other Ambulatory Visit (HOSPITAL_COMMUNITY): Payer: Self-pay | Admitting: Neurosurgery

## 2024-11-28 DIAGNOSIS — M5412 Radiculopathy, cervical region: Secondary | ICD-10-CM

## 2024-11-29 ENCOUNTER — Ambulatory Visit (HOSPITAL_COMMUNITY)
Admission: RE | Admit: 2024-11-29 | Discharge: 2024-11-29 | Disposition: A | Source: Ambulatory Visit | Attending: Neurosurgery | Admitting: Neurosurgery

## 2024-11-29 DIAGNOSIS — M5412 Radiculopathy, cervical region: Secondary | ICD-10-CM

## 2024-12-03 ENCOUNTER — Telehealth: Payer: Self-pay | Admitting: Radiology

## 2024-12-03 NOTE — Telephone Encounter (Signed)
 Would like a call back regarding documentation for his lawyer. Please call him back at [phone]

## 2024-12-04 ENCOUNTER — Telehealth: Payer: Self-pay

## 2024-12-04 ENCOUNTER — Ambulatory Visit

## 2024-12-04 NOTE — Telephone Encounter (Signed)
 Ricky Cook  PT, DPT

## 2024-12-05 ENCOUNTER — Telehealth: Payer: Self-pay | Admitting: Radiology
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
## 2024-12-06 NOTE — Telephone Encounter (Signed)
 Message sent to [facility] to review. This is a duplicate will sign off on this message.

## 2024-12-06 NOTE — Telephone Encounter (Signed)
 You saw this pt in October one time. Please see the message below and advise.

## 2024-12-09 ENCOUNTER — Other Ambulatory Visit (HOSPITAL_COMMUNITY): Payer: Self-pay | Admitting: Neurosurgery

## 2024-12-09 DIAGNOSIS — M5416 Radiculopathy, lumbar region: Secondary | ICD-10-CM

## 2024-12-10 ENCOUNTER — Ambulatory Visit: Admitting: Physical Therapy

## 2024-12-11 ENCOUNTER — Ambulatory Visit (HOSPITAL_COMMUNITY)
Admission: RE | Admit: 2024-12-11 | Discharge: 2024-12-11 | Disposition: A | Discharge status: Home / Self Care | Source: Ambulatory Visit | Attending: Neurosurgery | Admitting: Neurosurgery

## 2024-12-11 DIAGNOSIS — M51379 Other intervertebral disc degeneration, lumbosacral region without mention of lumbar back pain or lower extremity pain: Secondary | ICD-10-CM | POA: Diagnosis not present

## 2024-12-11 DIAGNOSIS — M4807 Spinal stenosis, lumbosacral region: Secondary | ICD-10-CM

## 2024-12-11 DIAGNOSIS — M5416 Radiculopathy, lumbar region: Secondary | ICD-10-CM | POA: Diagnosis present

## 2024-12-11 DIAGNOSIS — M5127 Other intervertebral disc displacement, lumbosacral region: Secondary | ICD-10-CM | POA: Diagnosis not present

## 2024-12-17 ENCOUNTER — Ambulatory Visit

## 2025-01-10 ENCOUNTER — Ambulatory Visit: Admitting: Physician Assistant

## 2025-01-10 DIAGNOSIS — M5126 Other intervertebral disc displacement, lumbar region: Secondary | ICD-10-CM | POA: Diagnosis not present

## 2025-01-10 MED ORDER — PREDNISONE 10 MG PO TABS: 10.0000 mg | ORAL_TABLET | Freq: Every day | ORAL | 0 refills | Status: AC
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
## 2025-01-11 ENCOUNTER — Encounter: Payer: Self-pay | Admitting: Physician Assistant

## 2025-03-03 ENCOUNTER — Ambulatory Visit: Admitting: Orthopedic Surgery
